# Patient Record
Sex: Male | Born: 1990 | Race: White | Hispanic: No | Marital: Single | Smoking: Current every day smoker
Health system: Southern US, Community
[De-identification: ages and names within clinical notes are randomized; demographics above are authoritative.]

## PROBLEM LIST (undated history)

## (undated) DIAGNOSIS — F1911 Other psychoactive substance abuse, in remission: Secondary | ICD-10-CM

---
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
## 2016-02-04 ENCOUNTER — Emergency Department
Admission: EM | Admit: 2016-02-04 | Discharge: 2016-02-04 | Disposition: A | Discharge status: Home / Self Care | Payer: Self-pay | Attending: Emergency Medicine | Admitting: Emergency Medicine

## 2016-02-04 DIAGNOSIS — Y9389 Activity, other specified: Secondary | ICD-10-CM | POA: Insufficient documentation

## 2016-02-04 DIAGNOSIS — F10129 Alcohol abuse with intoxication, unspecified: Secondary | ICD-10-CM | POA: Insufficient documentation

## 2016-02-04 DIAGNOSIS — S0181XA Laceration without foreign body of other part of head, initial encounter: Secondary | ICD-10-CM | POA: Insufficient documentation

## 2016-02-04 DIAGNOSIS — T148XXA Other injury of unspecified body region, initial encounter: Secondary | ICD-10-CM

## 2016-02-04 DIAGNOSIS — Y92009 Unspecified place in unspecified non-institutional (private) residence as the place of occurrence of the external cause: Secondary | ICD-10-CM | POA: Insufficient documentation

## 2016-02-04 DIAGNOSIS — F10920 Alcohol use, unspecified with intoxication, uncomplicated: Secondary | ICD-10-CM

## 2016-02-04 DIAGNOSIS — Y999 Unspecified external cause status: Secondary | ICD-10-CM | POA: Insufficient documentation

## 2016-02-04 NOTE — ED Notes (Signed)
Patient here in custody of [employer] in handcuffs for medical clearance for jail.

## 2016-02-04 NOTE — ED Notes (Addendum)
Pt was read discharge instructions. Pt verbalized understanding. Pt discharged into police custody with [place] [employer].
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
## 2018-01-12 ENCOUNTER — Emergency Department
Admission: EM | Admit: 2018-01-12 | Discharge: 2018-01-13 | Disposition: A | Discharge status: Home / Self Care | Payer: Self-pay | Attending: Emergency Medicine | Admitting: Emergency Medicine

## 2018-01-12 ENCOUNTER — Encounter: Payer: Self-pay | Admitting: Medical Oncology

## 2018-01-12 DIAGNOSIS — F1310 Sedative, hypnotic or anxiolytic abuse, uncomplicated: Secondary | ICD-10-CM

## 2018-01-12 DIAGNOSIS — Y92238 Other place in hospital as the place of occurrence of the external cause: Secondary | ICD-10-CM | POA: Insufficient documentation

## 2018-01-12 DIAGNOSIS — Y9389 Activity, other specified: Secondary | ICD-10-CM | POA: Insufficient documentation

## 2018-01-12 DIAGNOSIS — F23 Brief psychotic disorder: Secondary | ICD-10-CM

## 2018-01-12 DIAGNOSIS — W2209XA Striking against other stationary object, initial encounter: Secondary | ICD-10-CM | POA: Insufficient documentation

## 2018-01-12 DIAGNOSIS — Y999 Unspecified external cause status: Secondary | ICD-10-CM | POA: Insufficient documentation

## 2018-01-12 DIAGNOSIS — F1314 Sedative, hypnotic or anxiolytic abuse with sedative, hypnotic or anxiolytic-induced mood disorder: Secondary | ICD-10-CM | POA: Insufficient documentation

## 2018-01-12 DIAGNOSIS — S01312A Laceration without foreign body of left ear, initial encounter: Secondary | ICD-10-CM | POA: Insufficient documentation

## 2018-01-12 DIAGNOSIS — F1994 Other psychoactive substance use, unspecified with psychoactive substance-induced mood disorder: Secondary | ICD-10-CM

## 2018-01-12 LAB — ACETAMINOPHEN LEVEL: Acetaminophen (Tylenol), Serum: <10 ug/mL — ABNORMAL LOW (ref 10–30)

## 2018-01-12 LAB — CBC
HCT: 46.1 % (ref 40.0–52.0)
Hemoglobin: 15.8 g/dL (ref 13.0–18.0)
MCH: 31.1 pg (ref 26.0–34.0)
MCHC: 34.4 g/dL (ref 32.0–36.0)
MCV: 90.5 fL (ref 80.0–100.0)
PLATELETS: 246 10*3/uL (ref 150–440)
RBC: 5.09 MIL/uL (ref 4.40–5.90)
RDW: 12.5 % (ref 11.5–14.5)
WBC: 9.3 10*3/uL (ref 3.8–10.6)

## 2018-01-12 LAB — URINE DRUG SCREEN, QUALITATIVE (ARMC ONLY)
Amphetamines, Ur Screen: NOT DETECTED
BARBITURATES, UR SCREEN: NOT DETECTED
Benzodiazepine, Ur Scrn: POSITIVE — AB
CANNABINOID 50 NG, UR ~~LOC~~: NOT DETECTED
COCAINE METABOLITE, UR ~~LOC~~: NOT DETECTED
MDMA (Ecstasy)Ur Screen: NOT DETECTED
Methadone Scn, Ur: NOT DETECTED
Opiate, Ur Screen: NOT DETECTED
Phencyclidine (PCP) Ur S: NOT DETECTED
TRICYCLIC, UR SCREEN: NOT DETECTED

## 2018-01-12 LAB — COMPREHENSIVE METABOLIC PANEL
ALK PHOS: 71 U/L (ref 38–126)
ALT: 22 U/L (ref 17–63)
AST: 33 U/L (ref 15–41)
Albumin: 4.4 g/dL (ref 3.5–5.0)
Anion gap: 3 — ABNORMAL LOW (ref 5–15)
BUN: 12 mg/dL (ref 6–20)
CALCIUM: 9.3 mg/dL (ref 8.9–10.3)
CO2: 32 mmol/L (ref 22–32)
CREATININE: 0.89 mg/dL (ref 0.61–1.24)
Chloride: 104 mmol/L (ref 101–111)
Glucose, Bld: 85 mg/dL (ref 65–99)
Potassium: 5.2 mmol/L — ABNORMAL HIGH (ref 3.5–5.1)
Sodium: 139 mmol/L (ref 135–145)
TOTAL PROTEIN: 6.9 g/dL (ref 6.5–8.1)
Total Bilirubin: 0.6 mg/dL (ref 0.3–1.2)

## 2018-01-12 LAB — SALICYLATE LEVEL

## 2018-01-12 LAB — ETHANOL

## 2018-01-12 MED ORDER — HALOPERIDOL 5 MG PO TABS
5.0000 mg | ORAL_TABLET | Freq: Once | ORAL | Status: AC
  Administered 2018-01-12: 5 mg via ORAL
  Filled 2018-01-12: qty 1

## 2018-01-12 MED ORDER — HALOPERIDOL LACTATE 5 MG/ML IJ SOLN
INTRAMUSCULAR | Status: AC
  Filled 2018-01-12: qty 2

## 2018-01-12 MED ORDER — LORAZEPAM 2 MG/ML IJ SOLN
2.0000 mg | Freq: Once | INTRAMUSCULAR | Status: AC
  Administered 2018-01-12: 2 mg via INTRAMUSCULAR

## 2018-01-12 MED ORDER — LORAZEPAM 2 MG PO TABS
2.0000 mg | ORAL_TABLET | Freq: Once | ORAL | Status: AC
  Administered 2018-01-12: 2 mg via ORAL
  Filled 2018-01-12: qty 1

## 2018-01-12 MED ORDER — NICOTINE 14 MG/24HR TD PT24
14.0000 mg | MEDICATED_PATCH | Freq: Once | TRANSDERMAL | Status: DC
  Administered 2018-01-13: 14 mg via TRANSDERMAL
  Filled 2018-01-12: qty 1

## 2018-01-12 MED ORDER — DIPHENHYDRAMINE HCL 50 MG/ML IJ SOLN
INTRAMUSCULAR | Status: AC
  Administered 2018-01-12: 50 mg via INTRAMUSCULAR
  Filled 2018-01-12: qty 1

## 2018-01-12 MED ORDER — LORAZEPAM 2 MG/ML IJ SOLN
INTRAMUSCULAR | Status: AC
  Administered 2018-01-12: 2 mg via INTRAMUSCULAR
  Filled 2018-01-12: qty 1

## 2018-01-12 MED ORDER — DIPHENHYDRAMINE HCL 50 MG/ML IJ SOLN
50.0000 mg | Freq: Once | INTRAMUSCULAR | Status: AC
  Administered 2018-01-12: 50 mg via INTRAMUSCULAR

## 2018-01-12 MED ORDER — HALOPERIDOL LACTATE 5 MG/ML IJ SOLN
5.0000 mg | Freq: Once | INTRAMUSCULAR | Status: AC
  Administered 2018-01-12: 5 mg via INTRAMUSCULAR

## 2018-01-12 NOTE — ED Notes (Signed)
BEHAVIORAL HEALTH ROUNDING Patient sleeping: Yes.   Patient alert and oriented: not applicable SLEEPING Behavior appropriate: Yes.  ; If no, describe: SLEEPING Nutrition and fluids offered: No SLEEPING Toileting and hygiene offered: NoSLEEPING Sitter present: not applicable, Q 15 min safety rounds and observation. Law enforcement present: Yes ODS 
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
## 2018-01-12 NOTE — ED Notes (Signed)
Patient is IVC pending soc result Brian Osborn R.M.A
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
## 2018-01-12 NOTE — ED Notes (Signed)
This RN explained to the patient that he would most likely be here until sometime tomorrow and that he had been given medication to help him be calm and rest. Explained to pt that he should attempt to lay down on the bed and rest. Pt agreeable.

## 2018-01-12 NOTE — ED Notes (Signed)
Patient attempted to leave and had to be assisted back to his chair.

## 2018-01-12 NOTE — ED Notes (Signed)
ENVIRONMENTAL ASSESSMENT  Potentially harmful objects out of patient reach: Yes.  Personal belongings secured: Yes.  Patient dressed in hospital provided attire only: Yes.  Plastic bags out of patient reach: Yes.  Patient care equipment (cords, cables, call bells, lines, and drains) shortened, removed, or accounted for: Yes.  Equipment and supplies removed from bottom of stretcher: Yes.  Potentially toxic materials out of patient reach: Yes.  Sharps container removed or out of patient reach: Yes.   BEHAVIORAL HEALTH ROUNDING  Patient sleeping: No.  Patient alert and oriented: yes  Behavior appropriate: Yes. ; If no, describe:  Nutrition and fluids offered: Yes  Toileting and hygiene offered: Yes  Sitter present: yes for fall safety, Q 15 min safety rounds and observation.  Law enforcement present: Yes ODS  ED BHU PLACEMENT JUSTIFICATION  Is the patient under IVC or is there intent for IVC: Yes.  Is the patient medically cleared: Yes.  Is there vacancy in the ED BHU: Yes.  Is the population mix appropriate for patient: Yes.  Is the patient awaiting placement in inpatient or outpatient setting: Yes.  Has the patient had a psychiatric consult: Yes.  Survey of unit performed for contraband, proper placement and condition of furniture, tampering with fixtures in bathroom, shower, and each patient room: Yes. ; Findings: All clear  APPEARANCE/BEHAVIOR  Restless, agitated but redirectable and will follow commands at this time.   NEURO ASSESSMENT  Orientation: time, place and person  Hallucinations: No.None noted (Hallucinations)  Speech: Normal  Gait: slightly unsteady at this time pt has safety sitter at bedside.  RESPIRATORY ASSESSMENT  WNL  CARDIOVASCULAR ASSESSMENT  WNL  GASTROINTESTINAL ASSESSMENT  WNL  EXTREMITIES  WNL  PLAN OF CARE  Provide calm/safe environment. Vital signs assessed TID. ED BHU Assessment once each 12-hour shift. Collaborate with TTS daily or as condition  indicates. Assure the ED provider has rounded once each shift. Provide and encourage hygiene. Provide redirection as needed. Assess for escalating behavior; address immediately and inform ED provider.  Assess family dynamic and appropriateness for visitation as needed: Yes. ; If necessary, describe findings:  Educate the patient/family about BHU procedures/visitation: Yes. ; If necessary, describe findings: Pt is calm and cooperative at this time. Pt understanding and accepting of unit procedures/rules. Will continue to monitor with Q 15 min safety rounds and observation.

## 2018-01-12 NOTE — ED Notes (Signed)
Pt at this time has gone to sleep on the bed.

## 2018-01-12 NOTE — ED Provider Notes (Signed)
TTS attempted to conduct assessment.. Brian Osborn refused to participate.

## 2018-01-12 NOTE — ED Notes (Signed)
At 1030, patient became agitated stating "I'm not crazy.  You can't hold me prisoner."  Patient had been sitting, calm and cooperative, in recliner chair with Officer [place] sitting next to him.  Attempts made to verbally de-escalate patient, unsuccessful.  Patient jumped  out of chair and attempted to run from department, running through nursing station.  Patient tripped and fell forward, hitting forehead.  Laceration to left ear lobe.  No LOC.  Dr. Fanny BienQuale notified and immediately to patient to assess.  Medicated as per MAR to calm patient down, because after fall patient continued to be agitated, yelling out -- not responding to verbal attempts to de-escalate.  Patient sitting in recliner chair at this time.  Sitter with patient for safety.

## 2018-01-12 NOTE — ED Notes (Signed)
Patient transported to interview room with security and [facility]

## 2018-01-13 DIAGNOSIS — F1310 Sedative, hypnotic or anxiolytic abuse, uncomplicated: Secondary | ICD-10-CM

## 2018-01-13 DIAGNOSIS — F1994 Other psychoactive substance use, unspecified with psychoactive substance-induced mood disorder: Secondary | ICD-10-CM

## 2018-01-13 MED ORDER — NICOTINE 14 MG/24HR TD PT24
14.0000 mg | MEDICATED_PATCH | Freq: Once | TRANSDERMAL | Status: DC
  Administered 2018-01-13: 14 mg via TRANSDERMAL

## 2018-01-13 MED ORDER — NICOTINE 14 MG/24HR TD PT24
MEDICATED_PATCH | TRANSDERMAL | Status: AC
Start: 2018-01-13 — End: 2018-01-13
  Administered 2018-01-13: 14 mg
  Filled 2018-01-13: qty 1

## 2018-01-13 NOTE — ED Notes (Signed)
Hourly rounding reveals patient sleeping in room. No complaints, stable, in no acute distress. Q15 minute rounds and monitoring via Security Cameras to continue. 

## 2018-01-13 NOTE — ED Notes (Signed)
Patient voices understanding of discharge instructions, all belongings given back to Patient, Patient left per self. No signs of distress.
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
## 2018-01-13 NOTE — ED Notes (Signed)
Patient sitting in dayroom, calm and cooperative, no signs of distress.

## 2018-01-13 NOTE — ED Provider Notes (Signed)
Dr. Blair Haileylapcs is lifting IVC and recommends outpatient management.   Merrily Brittleifenbark, Harvey Lingo, MD 01/13/18 [phone]

## 2018-01-13 NOTE — ED Notes (Signed)
BEHAVIORAL HEALTH ROUNDING Patient sleeping: Yes.   Patient alert and oriented: not applicable SLEEPING Behavior appropriate: Yes.  ; If no, describe: SLEEPING Nutrition and fluids offered: No SLEEPING Toileting and hygiene offered: NoSLEEPING Sitter present: not applicable, Q 15 min safety rounds and observation. Law enforcement present: Yes ODS 
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
## 2018-01-13 NOTE — ED Notes (Signed)
IVC rescinded by Dr Clapacs/ To be D/C'd  

## 2018-01-13 NOTE — ED Notes (Signed)
Pt. Transferred to BHU from ED to room 5 after screening for contraband. Report to include Situation, Background, Assessment and Recommendations from Shelba FlakeAnne Cales RN. Pt. Oriented to unit including Q15 minute rounds as well as the security cameras for their protection. Patient is alert and oriented, warm and dry in no acute distress. Patient denies SI, HI, and AVH. Pt. Encouraged to let me know if needs arise.

## 2018-01-13 NOTE — ED Provider Notes (Signed)
-----------------------------------------   7:19 AM on 01/13/2018 -----------------------------------------   Blood pressure 102/61, pulse 81, temperature 97.7 F (36.5 C), temperature source Oral, resp. rate 18, height 6\' 2"  (1.88 m), weight 73.5 kg (162 lb), SpO2 98 %.  The patient had no acute events since last update.  Calm and cooperative at this time.  Disposition is pending Psychiatry/Behavioral Medicine team recommendations.     Rebecka ApleyWebster, Allison P, MD 01/13/18 [phone]

## 2018-01-13 NOTE — ED Notes (Signed)
Patient is up to bathroom, no signs of distress, no behavioral issues, will continue to monitor.

## 2018-02-14 ENCOUNTER — Emergency Department
Admission: EM | Admit: 2018-02-14 | Discharge: 2018-02-14 | Disposition: A | Discharge status: Home / Self Care | Payer: Self-pay | Attending: Emergency Medicine | Admitting: Emergency Medicine

## 2018-02-14 ENCOUNTER — Emergency Department: Payer: Self-pay

## 2018-02-14 ENCOUNTER — Other Ambulatory Visit: Payer: Self-pay

## 2018-02-14 ENCOUNTER — Encounter: Payer: Self-pay | Admitting: Emergency Medicine

## 2018-02-14 DIAGNOSIS — Y929 Unspecified place or not applicable: Secondary | ICD-10-CM | POA: Insufficient documentation

## 2018-02-14 DIAGNOSIS — Y999 Unspecified external cause status: Secondary | ICD-10-CM | POA: Insufficient documentation

## 2018-02-14 DIAGNOSIS — S060X0A Concussion without loss of consciousness, initial encounter: Secondary | ICD-10-CM | POA: Insufficient documentation

## 2018-02-14 DIAGNOSIS — H05233 Hemorrhage of bilateral orbit: Secondary | ICD-10-CM

## 2018-02-14 DIAGNOSIS — S0012XA Contusion of left eyelid and periocular area, initial encounter: Secondary | ICD-10-CM | POA: Insufficient documentation

## 2018-02-14 DIAGNOSIS — S022XXA Fracture of nasal bones, initial encounter for closed fracture: Secondary | ICD-10-CM | POA: Insufficient documentation

## 2018-02-14 DIAGNOSIS — S0011XA Contusion of right eyelid and periocular area, initial encounter: Secondary | ICD-10-CM | POA: Insufficient documentation

## 2018-02-14 DIAGNOSIS — H5711 Ocular pain, right eye: Secondary | ICD-10-CM | POA: Insufficient documentation

## 2018-02-14 DIAGNOSIS — F17210 Nicotine dependence, cigarettes, uncomplicated: Secondary | ICD-10-CM | POA: Insufficient documentation

## 2018-02-14 DIAGNOSIS — J3489 Other specified disorders of nose and nasal sinuses: Secondary | ICD-10-CM | POA: Insufficient documentation

## 2018-02-14 DIAGNOSIS — Z5321 Procedure and treatment not carried out due to patient leaving prior to being seen by health care provider: Secondary | ICD-10-CM | POA: Insufficient documentation

## 2018-02-14 DIAGNOSIS — H5712 Ocular pain, left eye: Secondary | ICD-10-CM | POA: Insufficient documentation

## 2018-02-14 DIAGNOSIS — Y9389 Activity, other specified: Secondary | ICD-10-CM | POA: Insufficient documentation

## 2018-02-14 MED ORDER — OXYCODONE HCL 5 MG PO TABS: 5.0000 mg | ORAL_TABLET | Freq: Three times a day (TID) | ORAL | 0 refills | Status: DC | PRN

## 2018-02-14 MED ORDER — OXYCODONE HCL 5 MG PO TABS
5.0000 mg | ORAL_TABLET | Freq: Once | ORAL | Status: AC
  Administered 2018-02-14: 5 mg via ORAL
  Filled 2018-02-14: qty 1

## 2018-02-14 MED ORDER — IBUPROFEN 600 MG PO TABS: 600.0000 mg | ORAL_TABLET | Freq: Four times a day (QID) | ORAL | 0 refills | Status: DC | PRN

## 2018-02-14 NOTE — ED Notes (Signed)
Pt repeatedly out of room wandering around department and multiple requests made by nursing staff for pt to return to room. Pt states "then why don't you go and [place] you'd be perfect for that".
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
## 2018-02-14 NOTE — Discharge Instructions (Addendum)
Please apply ice to the nose to help improve swelling over the next few days.  Call ENT physician Monday morning to schedule follow-up appointment for 3 to 4 days from now.  Return to the ER for any increasing headache, nausea, vomiting, worsening symptoms or urgent changes in your health.

## 2018-02-14 NOTE — ED Notes (Signed)
Patient transported to CT 

## 2018-02-14 NOTE — ED Notes (Signed)
Patient attempted to be taken to a room, and is declining to be evaluated now.  Patient advised to stay by staff, but refused at this time and states he will return tomorrow when he can be seen sooner.
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
## 2018-02-14 NOTE — ED Notes (Signed)
I escorted Mr. Brian Osborn to room 54. He then asked me would this take a long time. I stated that we were very busy and was unable to give him a specific timeframe. He then stated to me "can you just look at me and tell me what is wrong." I said no. You must be evaluated by a physician. He then stated, "I have more important tings to do, I will return to the ER tomorrow if I need to."  He then walked out and went back to the lobby. I explain to first nurse RN A. Moffitt what just happened and she is going to remove him from the board.
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
## 2018-05-21 ENCOUNTER — Other Ambulatory Visit: Payer: Self-pay

## 2018-05-21 ENCOUNTER — Emergency Department
Admission: EM | Admit: 2018-05-21 | Discharge: 2018-05-22 | Disposition: A | Discharge status: Home / Self Care | Payer: Self-pay | Attending: Emergency Medicine | Admitting: Emergency Medicine

## 2018-05-21 DIAGNOSIS — F17210 Nicotine dependence, cigarettes, uncomplicated: Secondary | ICD-10-CM | POA: Insufficient documentation

## 2018-05-21 DIAGNOSIS — F1994 Other psychoactive substance use, unspecified with psychoactive substance-induced mood disorder: Secondary | ICD-10-CM | POA: Insufficient documentation

## 2018-05-21 DIAGNOSIS — F29 Unspecified psychosis not due to a substance or known physiological condition: Secondary | ICD-10-CM

## 2018-05-21 DIAGNOSIS — Z79899 Other long term (current) drug therapy: Secondary | ICD-10-CM | POA: Insufficient documentation

## 2018-05-21 LAB — URINE DRUG SCREEN, QUALITATIVE (ARMC ONLY)
Amphetamines, Ur Screen: POSITIVE — AB
BARBITURATES, UR SCREEN: NOT DETECTED
Cannabinoid 50 Ng, Ur ~~LOC~~: NOT DETECTED
Cocaine Metabolite,Ur ~~LOC~~: NOT DETECTED
MDMA (Ecstasy)Ur Screen: NOT DETECTED
Methadone Scn, Ur: NOT DETECTED
Opiate, Ur Screen: NOT DETECTED
Phencyclidine (PCP) Ur S: NOT DETECTED
TRICYCLIC, UR SCREEN: NOT DETECTED

## 2018-05-21 LAB — COMPREHENSIVE METABOLIC PANEL
ALK PHOS: 67 U/L (ref 38–126)
ALT: 22 U/L (ref 0–44)
AST: 36 U/L (ref 15–41)
Albumin: 5 g/dL (ref 3.5–5.0)
Anion gap: 8 (ref 5–15)
BILIRUBIN TOTAL: 1.5 mg/dL — AB (ref 0.3–1.2)
BUN: 11 mg/dL (ref 6–20)
CALCIUM: 9.3 mg/dL (ref 8.9–10.3)
CO2: 25 mmol/L (ref 22–32)
CREATININE: 0.88 mg/dL (ref 0.61–1.24)
Chloride: 105 mmol/L (ref 98–111)
Glucose, Bld: 111 mg/dL — ABNORMAL HIGH (ref 70–99)
Potassium: 4 mmol/L (ref 3.5–5.1)
SODIUM: 138 mmol/L (ref 135–145)
TOTAL PROTEIN: 7.3 g/dL (ref 6.5–8.1)

## 2018-05-21 LAB — CBC WITH DIFFERENTIAL/PLATELET
Basophils Absolute: 0 10*3/uL (ref 0–0.1)
Basophils Relative: 1 %
EOS ABS: 0.2 10*3/uL (ref 0–0.7)
EOS PCT: 2 %
HCT: 41.9 % (ref 40.0–52.0)
Hemoglobin: 15 g/dL (ref 13.0–18.0)
LYMPHS ABS: 1 10*3/uL (ref 1.0–3.6)
Lymphocytes Relative: 14 %
MCH: 31.8 pg (ref 26.0–34.0)
MCHC: 35.7 g/dL (ref 32.0–36.0)
MCV: 89 fL (ref 80.0–100.0)
MONOS PCT: 8 %
Monocytes Absolute: 0.6 10*3/uL (ref 0.2–1.0)
Neutro Abs: 5.1 10*3/uL (ref 1.4–6.5)
Neutrophils Relative %: 75 %
PLATELETS: 217 10*3/uL (ref 150–440)
RBC: 4.71 MIL/uL (ref 4.40–5.90)
RDW: 12.3 % (ref 11.5–14.5)
WBC: 6.9 10*3/uL (ref 3.8–10.6)

## 2018-05-21 LAB — LIPASE, BLOOD: Lipase: 22 U/L (ref 11–51)

## 2018-05-21 MED ORDER — LORAZEPAM 2 MG/ML IJ SOLN
2.0000 mg | Freq: Once | INTRAMUSCULAR | Status: AC
Start: 2018-05-21 — End: 2018-05-21
  Administered 2018-05-21: 2 mg via INTRAVENOUS

## 2018-05-21 MED ORDER — HALOPERIDOL LACTATE 5 MG/ML IJ SOLN
5.0000 mg | Freq: Once | INTRAMUSCULAR | Status: AC
  Administered 2018-05-21: 5 mg via INTRAMUSCULAR
  Filled 2018-05-21: qty 1

## 2018-05-21 MED ORDER — LORAZEPAM 2 MG/ML IJ SOLN
2.0000 mg | Freq: Once | INTRAMUSCULAR | Status: AC
  Administered 2018-05-21: 2 mg via INTRAMUSCULAR

## 2018-05-21 MED ORDER — LORAZEPAM 2 MG/ML IJ SOLN
INTRAMUSCULAR | Status: AC
  Filled 2018-05-21: qty 1

## 2018-05-21 NOTE — ED Triage Notes (Signed)
Pt arrives via ems from neighbors house. Ems states pt went to neighbors house naked stating he was being bitten by knats and bugs were crawling on him. Ems states pt poured alcohol over head trying to kill bugs but pt states did not work. pt states snorted meth this morning. Pt also took two  Benadryl pill due to bites burning

## 2018-05-21 NOTE — ED Notes (Addendum)
Rash (red,blottchy, and flat in appearance) appeared on chest and stomach but is now clearing up. Pt became tearful and upset when rash started to appear on torso and arms. RN was also pushing IV ativan at the time rash started to appear but quickly cleared up and no longer visible. Pt states he is not allergic to ativan and has taken it in the past. Unsure if rash may be possible reaction to ativan or due to becoming extremely upset and crying

## 2018-05-21 NOTE — Consult Note (Signed)
  Psychiatry: Consult received.  Chart reviewed.  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  He has been knocked out sleeping throughout the day.  I have tried several times to wake him up for assessment so far without success.  Patient is currently physically stable.  I will attempt to reassess him tomorrow morning when, I hope, he has woken up enough to have a conversation.

## 2018-05-21 NOTE — BH Assessment (Signed)
Assessment Note  Brian Osborn is an 27 y.o. male. Patient presents to [facility] via EMS due neighbor reporting patient went their home naked stating being bitten knats and bugs were crawling on him. EMS states patient poured alcohol over his head trying to kill bugs. Patient denies substance use during assessment, however triage note indicating he snorted meth this morning and took two Benedryl pill due to bites burning. Patient denies SI/HI/AVH.  Patient has a court date 06/05/2018 due shoplifting concealment goods and probation violation.   Patient denies inpatient psychiatric treatment and outpatient mental health.  Diagnosis: Substance Induced Disorder  Past Medical History: History reviewed. No pertinent past medical history.  History reviewed. No pertinent surgical history.  Family History: History reviewed. No pertinent family history.  Social History:  reports that he has been smoking cigarettes. He has been smoking about 0.50 packs per day. He has never used smokeless tobacco. He reports that he drinks alcohol. He reports that he has current or past drug history. Drug: Methamphetamines.  Additional Social History:  Alcohol / Drug Use Pain Medications: SEE PTA  Prescriptions: SEE PTA  Over the Counter: SEE PTA  History of alcohol / drug use?: Yes Longest period of sobriety (when/how long): Unknown Substance #1 Name of Substance 1: Meth  1 - Age of First Use: Unknown 1 - Amount (size/oz): Unknown 1 - Frequency: Unknown 1 - Duration: Unknown 1 - Last Use / Amount: Unknown  CIWA: CIWA-Ar BP: 107/62 Pulse Rate: 66 COWS:    Allergies:  Allergies  Allergen Reactions  . Bee Venom Hives    Home Medications:  (Not in a hospital admission)  OB/GYN Status:  No LMP for male patient.  General Assessment Data Assessment unable to be completed: (Assessment) [facility] Assessment: In system Is this a Tele or Face-to-Face Assessment?: Tele  Assessment Is this an Initial Assessment or a Re-assessment for this encounter?: Initial Assessment Marital status: Single Maiden name: None reported Is patient pregnant?: No Pregnancy Status: No Living Arrangements: Parent Can pt return to current living arrangement?: Yes Admission Status: Involuntary Is patient capable of signing voluntary admission?: Yes Referral Source: Self/Family/Friend Insurance type: No insurance  Medical Screening Exam [facility] Walk-in ONLY) Medical Exam completed: Yes  Crisis Care Plan Living Arrangements: Parent Legal Guardian: Other:(None reported) Name of Psychiatrist: None reported  Name of Therapist: None reported  Education Status Is patient currently in school?: No Is the patient employed, unemployed or receiving disability?: Unemployed  Risk to self with the past 6 months Suicidal Ideation: No Has patient been a risk to self within the past 6 months prior to admission? : No Suicidal Intent: No Has patient had any suicidal intent within the past 6 months prior to admission? : No Is patient at risk for suicide?: No Suicidal Plan?: No Has patient had any suicidal plan within the past 6 months prior to admission? : No Access to Means: No What has been your use of drugs/alcohol within the last 12 months?: Meth Previous Attempts/Gestures: No How many times?: 0 Other Self Harm Risks: Ongoing subtance use Triggers for Past Attempts: Other (Comment)(None reported) Intentional Self Injurious Behavior: None Family Suicide History: No Recent stressful life event(s): Other (Comment)(None reported) Persecutory voices/beliefs?: No Depression: No Depression Symptoms: (None reported) Substance abuse history and/or treatment for substance abuse?: Yes Suicide prevention information given to non-admitted patients: Not applicable  Risk to Others within the past 6 months Homicidal Ideation: No Does patient have any lifetime risk  of violence toward others  beyond the six months prior to admission? : No Thoughts of Harm to Others: No Current Homicidal Intent: No Current Homicidal Plan: No Access to Homicidal Means: No Identified Victim: None reported History of harm to others?: No Assessment of Violence: None Noted Violent Behavior Description: None reported Does patient have access to weapons?: No Criminal Charges Pending?: Yes Describe Pending Criminal Charges: shoplifting concealment goods, probation violation Does patient have a court date: Yes Court Date: 06/05/18 Is patient on probation?: Yes  Psychosis Hallucinations: None noted Delusions: Somatic  Mental Status Report Appearance/Hygiene: Other (Comment)(unclothed, with a sheet covering private area) Eye Contact: Fair Motor Activity: Mannerisms Speech: Pressured Level of Consciousness: Alert Mood: Anxious, Suspicious Affect: Anxious Anxiety Level: Severe Thought Processes: Circumstantial Judgement: Impaired Orientation: Person, Place, Time, Situation, Appropriate for developmental age Obsessive Compulsive Thoughts/Behaviors: None  Cognitive Functioning Concentration: Fair Memory: Recent Intact, Remote Intact Is patient IDD: No Is patient DD?: No Insight: Poor Impulse Control: Poor Appetite: Good Have you had any weight changes? : No Change Sleep: No Change Total Hours of Sleep: 7 Vegetative Symptoms: None  ADLScreening [facility] Assessment Services) Patient's cognitive ability adequate to safely complete daily activities?: Yes Patient able to express need for assistance with ADLs?: Yes Independently performs ADLs?: Yes (appropriate for developmental age)  Prior Inpatient Therapy Prior Inpatient Therapy: No  Prior Outpatient Therapy Prior Outpatient Therapy: No Does patient have an ACCT team?: No Does patient have Intensive In-House Services?  : No Does patient have [facility] services? : No Does patient have P4CC services?: No  ADL Screening (condition at  time of admission) Patient's cognitive ability adequate to safely complete daily activities?: Yes Is the patient deaf or have difficulty hearing?: No Does the patient have difficulty seeing, even when wearing glasses/contacts?: No Does the patient have difficulty concentrating, remembering, or making decisions?: No Patient able to express need for assistance with ADLs?: Yes Does the patient have difficulty dressing or bathing?: No Independently performs ADLs?: Yes (appropriate for developmental age) Does the patient have difficulty walking or climbing stairs?: No Weakness of Legs: None Weakness of Arms/Hands: None  Home Assistive Devices/Equipment Home Assistive Devices/Equipment: None  Therapy Consults (therapy consults require a physician order) PT Evaluation Needed: No OT Evalulation Needed: No SLP Evaluation Needed: No       Advance Directives (For Healthcare) Does Patient Have a Medical Advance Directive?: No Would patient like information on creating a medical advance directive?: No - Patient declined          Disposition:  Disposition Initial Assessment Completed for this Encounter: Yes Patient referred to: Other (Comment)(pending psych consult )  On Site Evaluation by:   Reviewed with Physician:    Galen ManilaFEDORIA L Omolola Mittman, LPC, LCAS-A 05/21/2018 2:02 PM

## 2018-05-21 NOTE — ED Notes (Signed)
Woke pt up and he is calm and cooperative. Pt says that he did have tiny spiders all over him last night and he was snorting meth. Pt ambulatory to give urine.

## 2018-05-21 NOTE — ED Notes (Signed)
Hourly rounding reveals patient sleeping in room. No complaints, stable, in no acute distress. Q15 minute rounds and monitoring via Security Cameras to continue. 

## 2018-05-21 NOTE — ED Notes (Signed)
Pt ambulated to the bathroom and provided UA. Pt states "they don't believe that spiders were climbing all over me, but they were cause I got in a nest of them."

## 2018-05-21 NOTE — ED Notes (Signed)
Pt. Placed in BHU #5 from quad.  Pt. States he has been in unit before.  Pt. Advised of cameras in Unit and 15 min. Safety checks.  Pt. Given pair of socks.  IV in lt. Forearm removed via unit policy.  Pt. Requested and was given meal tray, OJ drink and remote for tv.

## 2018-05-21 NOTE — ED Notes (Signed)
Pt states he has bugs or spiders all over body biting him. No bugs observed/ present on pt at this time. No rash or bite marks present on body at this time. Pt tearful during assessment and very anxious. Pt stripping clothing and blankets off body because he states "they are all over me, biting me, its burning, and I have venom streaming through my body". Pt redirected by RN in attempts to calm pt. Pt remains very anxious and tachycardic at this time.

## 2018-05-21 NOTE — ED Provider Notes (Signed)
[facility] Emergency Department Provider Note   ____________________________________________   First MD Initiated Contact with Patient 05/21/18 1136     (approximate)  I have reviewed the triage vital signs and the nursing notes.   HISTORY  Chief Complaint Drug Problem and Hallucinations    HPI Wende MottKory L Chandonnet is a 27 y.o. male patient comes via EMS.  Patient with a neighbor's house naked saying he was being bitten by knots and bugs were crawling in him in on him.  Patient in the emergency room remains naked saying his stomach is dissolving and the baby spiders or eating him a biting him.  He points to some lint on his arm and says that that is what that is.  He snorted meth this morning.  He also took 2 Benadryl's p.o.   History reviewed. No pertinent past medical history.  Patient Active Problem List   Diagnosis Date Noted  . Substance induced mood disorder (HCC) 01/13/2018  . Benzodiazepine abuse (HCC) 01/13/2018    History reviewed. No pertinent surgical history.  Prior to Admission medications   Medication Sig Start Date End Date Taking? Authorizing Provider  ibuprofen (ADVIL,MOTRIN) 600 MG tablet Take 1 tablet (600 mg total) by mouth every 6 (six) hours as needed for moderate pain. 02/14/18   Evon SlackGaines, Thomas C, PA-C  oxyCODONE (ROXICODONE) 5 MG immediate release tablet Take 1 tablet (5 mg total) by mouth every 8 (eight) hours as needed. 02/14/18 02/14/19  Evon SlackGaines, Thomas C, PA-C    Allergies Bee venom  History reviewed. No pertinent family history.  Social History Social History   Tobacco Use  . Smoking status: Current Every Day Smoker    Packs/day: 0.50    Types: Cigarettes  . Smokeless tobacco: Never Used  Substance Use Topics  . Alcohol use: Yes    Comment: socially  . Drug use: Yes    Types: Methamphetamines    Comment: snorted today    Review of Systems  Constitutional: No fever/chills Eyes: No visual changes. ENT: No sore  throat. Cardiovascular: Denies chest pain. Respiratory: Denies shortness of breath. Gastrointestinal:  abdominal pain.  No nausea, no vomiting.  No diarrhea.  No constipation. Genitourinary: Negative for dysuria. Musculoskeletal: Negative for back pain. Skin: Negative for rash. Neurological: Negative for headaches, focal weakness   ____________________________________________   PHYSICAL EXAM:  VITAL SIGNS: ED Triage Vitals  Enc Vitals Group     BP 05/21/18 1141 (!) 153/115     Pulse Rate 05/21/18 1141 (!) 130     Resp 05/21/18 1141 20     Temp 05/21/18 1141 98 F (36.7 C)     Temp Source 05/21/18 1141 Oral     SpO2 05/21/18 1141 98 %     Weight 05/21/18 1143 175 lb (79.4 kg)     Height 05/21/18 1143 6\' 2"  (1.88 m)     Head Circumference --      Peak Flow --      Pain Score 05/21/18 1142 10     Pain Loc --      Pain Edu? --      Excl. in GC? --     Constitutional: Alert and oriented.  Very anxious completely naked getting agitated Eyes: Conjunctivae are normal.  Pupils are dilated. EOMI. Head: Atraumatic. Nose: No congestion/rhinnorhea. Mouth/Throat: Mucous membranes are moist.  Oropharynx non-erythematous. Neck: No stridor.  Cardiovascular: Normal rate, regular rhythm. Grossly normal heart sounds.  Good peripheral circulation. Respiratory: Normal respiratory effort.  No  retractions. Lungs CTAB. Gastrointestinal: Soft and nontender. No distention. No abdominal bruits. No CVA tenderness. Musculoskeletal: No lower extremity tenderness nor edema.  . Neurologic:  Normal speech and language. No gross focal neurologic deficits are appreciated. Skin:  Skin is warm, dry there are some scratches where he is been scratching on his skin.  The deepest one is just barely break the skin. Psychiatric: Patient is actively hallucinating.  ____________________________________________   LABS (all labs ordered are listed, but only abnormal results are displayed)  Labs Reviewed    COMPREHENSIVE METABOLIC PANEL - Abnormal; Notable for the following components:      Result Value   Glucose, Bld 111 (*)    Total Bilirubin 1.5 (*)    All other components within normal limits  LIPASE, BLOOD  CBC WITH DIFFERENTIAL/PLATELET  URINE DRUG SCREEN, QUALITATIVE ([facility] ONLY)   ____________________________________________  EKG  EKG read and interpreted by me shows sinus tachycardia rate of 111 rightward axis no acute ST-T wave changes except for some possible early repolarization. ____________________________________________  RADIOLOGY  ED MD interpretation:   Official radiology report(s): No results found.  ____________________________________________   PROCEDURES  Procedure(s) performed:   Procedures  Critical Care performed:  ____________________________________________   INITIAL IMPRESSION / ASSESSMENT AND PLAN / ED COURSE   She is lab work is essentially negative psychosis seems to come on after using his methamphetamine we will observe him wait for him to clear if at all possible have psychiatry see him.        ____________________________________________   FINAL CLINICAL IMPRESSION(S) / ED DIAGNOSES  Final diagnoses:  Psychosis, unspecified psychosis type [facility](HCC)     ED Discharge Orders    None       Note:  This document was prepared using Dragon voice recognition software and may include unintentional dictation errors.    Arnaldo NatalMalinda, Aaron Bostwick F, MD 05/21/18 [phone]

## 2018-05-22 MED ORDER — NICOTINE 21 MG/24HR TD PT24
21.0000 mg | MEDICATED_PATCH | Freq: Once | TRANSDERMAL | Status: DC
  Administered 2018-05-22: 21 mg via TRANSDERMAL
  Filled 2018-05-22: qty 1

## 2018-05-22 NOTE — ED Notes (Signed)
Hourly rounding reveals patient sleeping in room. No complaints, stable, in no acute distress. Q15 minute rounds and monitoring via Security Cameras to continue. 

## 2018-05-22 NOTE — ED Notes (Signed)
Patient was discharged per order. AVS was reviewed with patient. Pt was given an opportunity to ask questions and verbalized understanding of all discharge paperwork. One bag of belonging was returned, and patient verbalized receipt Patient said he was ready for discharge and appeared in no acute distress when escorted to lobby where a ride was coming to pick him up.

## 2018-05-22 NOTE — ED Notes (Signed)
Pt used phone to call for ride.

## 2018-05-22 NOTE — ED Notes (Signed)
Brian Osborn is irritable, asking when the doctor will be here so he can be released. He says he only came in "for an allergic reaction" and that it is unfair that he has been detained. He denies any delusions, AVH, SI, or HI. He asked for a nicotine patch for withdrawal, which was provided. Will monitor for needs/safety.

## 2018-05-22 NOTE — ED Provider Notes (Signed)
Patient has been cleared by Dr. Toni Amendlapacs for discharge   Brian Osborn, Jonathan E, MD 05/22/18 [phone]

## 2018-05-22 NOTE — ED Notes (Signed)
Report received from previous shift. Pt is resting quietly in room, with eyes closed and regular, even, and unlabored respirations. Will continue to monitor for needs and safety.  

## 2018-05-22 NOTE — ED Notes (Signed)
Hourly rounding reveals patient in rest room. No complaints, stable, in no acute distress. Q15 minute rounds and monitoring via Security Cameras to continue. 

## 2018-05-22 NOTE — ED Notes (Signed)
Hourly rounding reveals patient in sleeping room. No complaints, stable, in no acute distress. Q15 minute rounds and monitoring via Security Cameras to continue. 

## 2018-06-19 ENCOUNTER — Encounter: Payer: Self-pay | Admitting: Emergency Medicine

## 2018-06-19 ENCOUNTER — Other Ambulatory Visit: Payer: Self-pay

## 2018-06-19 ENCOUNTER — Emergency Department
Admission: EM | Admit: 2018-06-19 | Discharge: 2018-06-19 | Disposition: A | Discharge status: Home / Self Care | Payer: Self-pay | Attending: Emergency Medicine | Admitting: Emergency Medicine

## 2018-06-19 DIAGNOSIS — F1510 Other stimulant abuse, uncomplicated: Secondary | ICD-10-CM

## 2018-06-19 DIAGNOSIS — F17210 Nicotine dependence, cigarettes, uncomplicated: Secondary | ICD-10-CM | POA: Insufficient documentation

## 2018-06-19 DIAGNOSIS — F15950 Other stimulant use, unspecified with stimulant-induced psychotic disorder with delusions: Secondary | ICD-10-CM

## 2018-06-19 DIAGNOSIS — F29 Unspecified psychosis not due to a substance or known physiological condition: Secondary | ICD-10-CM

## 2018-06-19 DIAGNOSIS — F19951 Other psychoactive substance use, unspecified with psychoactive substance-induced psychotic disorder with hallucinations: Secondary | ICD-10-CM | POA: Insufficient documentation

## 2018-06-19 LAB — URINE DRUG SCREEN, QUALITATIVE (ARMC ONLY)
Amphetamines, Ur Screen: POSITIVE — AB
Barbiturates, Ur Screen: NOT DETECTED
Benzodiazepine, Ur Scrn: POSITIVE — AB
COCAINE METABOLITE, UR ~~LOC~~: NOT DETECTED
Cannabinoid 50 Ng, Ur ~~LOC~~: NOT DETECTED
MDMA (ECSTASY) UR SCREEN: NOT DETECTED
Methadone Scn, Ur: NOT DETECTED
OPIATE, UR SCREEN: NOT DETECTED
PHENCYCLIDINE (PCP) UR S: NOT DETECTED
Tricyclic, Ur Screen: NOT DETECTED

## 2018-06-19 LAB — COMPREHENSIVE METABOLIC PANEL
ALBUMIN: 5 g/dL (ref 3.5–5.0)
ALT: 36 U/L (ref 0–44)
AST: 73 U/L — AB (ref 15–41)
Alkaline Phosphatase: 79 U/L (ref 38–126)
Anion gap: 10 (ref 5–15)
BUN: 25 mg/dL — AB (ref 6–20)
CHLORIDE: 101 mmol/L (ref 98–111)
CO2: 26 mmol/L (ref 22–32)
CREATININE: 1.08 mg/dL (ref 0.61–1.24)
Calcium: 9.4 mg/dL (ref 8.9–10.3)
GFR calc Af Amer: >60 mL/min (ref >60)
GFR calc non Af Amer: >60 mL/min (ref >60)
GLUCOSE: 200 mg/dL — AB (ref 70–99)
POTASSIUM: 3.6 mmol/L (ref 3.5–5.1)
Sodium: 137 mmol/L (ref 135–145)
Total Bilirubin: 0.9 mg/dL (ref 0.3–1.2)
Total Protein: 8.1 g/dL (ref 6.5–8.1)

## 2018-06-19 LAB — ACETAMINOPHEN LEVEL: Acetaminophen (Tylenol), Serum: <10 ug/mL — ABNORMAL LOW (ref 10–30)

## 2018-06-19 LAB — CBC WITH DIFFERENTIAL/PLATELET
Basophils Absolute: 0.1 10*3/uL (ref 0–0.1)
Basophils Relative: 1 %
EOS PCT: 0 %
Eosinophils Absolute: 0 10*3/uL (ref 0–0.7)
HCT: 41.5 % (ref 40.0–52.0)
Hemoglobin: 14.9 g/dL (ref 13.0–18.0)
LYMPHS PCT: 12 %
Lymphs Abs: 1.2 10*3/uL (ref 1.0–3.6)
MCH: 31.7 pg (ref 26.0–34.0)
MCHC: 36 g/dL (ref 32.0–36.0)
MCV: 88.2 fL (ref 80.0–100.0)
MONO ABS: 0.8 10*3/uL (ref 0.2–1.0)
Monocytes Relative: 8 %
Neutro Abs: 8.2 10*3/uL — ABNORMAL HIGH (ref 1.4–6.5)
Neutrophils Relative %: 79 %
PLATELETS: 251 10*3/uL (ref 150–440)
RBC: 4.71 MIL/uL (ref 4.40–5.90)
RDW: 12.8 % (ref 11.5–14.5)
WBC: 10.1 10*3/uL (ref 3.8–10.6)

## 2018-06-19 LAB — SALICYLATE LEVEL: Salicylate Lvl: <7 mg/dL (ref 2.8–30.0)

## 2018-06-19 LAB — ETHANOL: Alcohol, Ethyl (B): <10 mg/dL (ref <10)

## 2018-06-19 MED ORDER — LORAZEPAM 2 MG/ML IJ SOLN
1.0000 mg | Freq: Once | INTRAMUSCULAR | Status: AC
  Administered 2018-06-19: 1 mg via INTRAVENOUS
  Filled 2018-06-19: qty 1

## 2018-06-19 MED ORDER — NICOTINE POLACRILEX 2 MG MT GUM
2.0000 mg | CHEWING_GUM | OROMUCOSAL | Status: DC | PRN
  Administered 2018-06-19: 2 mg via ORAL
  Filled 2018-06-19 (×2): qty 1

## 2018-06-19 MED ORDER — OLANZAPINE 10 MG PO TABS: 10.0000 mg | ORAL_TABLET | Freq: Every day | ORAL | 0 refills | Status: DC

## 2018-06-19 NOTE — ED Notes (Signed)
Pt dressed out. Pt belongings bag: hat, shorts, shirt, pair of shoes, lighter, 1 cigarette. Pt moved to Rm 23.

## 2018-06-19 NOTE — BH Assessment (Signed)
Assessment Note  Brian Osborn is an 27 y.o. male who presents to the ER via his father, after he asked him to bring him. Patient reports of having "bugs, worm and beetles" coming out of his skin. Patient states he believes he have a rare disease and afraid he is going to die. Patient was seen in the ER for similar presentation 05/2018. Patient admitted to using "Ice (methamphetamine)" approximately a day ago, prior to coming to the ER. When writer asked questions about his substance use, he minimized it and stated he was afraid he was going to be "looked at as a drug user and y'all say I'm just high and don't help me."  During the interview, the patient was fixated on the what he was seeing coming out of his skin. He was the only one who could see it. He was able to participate in the interview and provide appropriate answers to the questions except what related to the "bugs" coming out of his skin.   Patient denies SI/HI and AV/H.  Patient currently receive outpatient treatment from [facility]   Diagnosis: Substance Induced Psychosis  Past Medical History: History reviewed. No pertinent past medical history.  History reviewed. No pertinent surgical history.  Family History: No family history on file.  Social History:  reports that he has been smoking cigarettes. He has been smoking about 0.50 packs per day. He has never used smokeless tobacco. He reports that he drinks alcohol. He reports that he has current or past drug history. Drug: Methamphetamines.  Additional Social History:  Alcohol / Drug Use Pain Medications: SEE PTA  Prescriptions: SEE PTA  Over the Counter: SEE PTA  History of alcohol / drug use?: Yes Longest period of sobriety (when/how long): Unable to quantify Negative Consequences of Use: Personal relationships Substance #1 Name of Substance 1: "Ice" Meth  1 - Age of First Use: Unable to quantify 1 - Amount (size/oz): Unable to quantify 1 - Frequency:  Unable to quantify 1 - Duration: Unable to quantify 1 - Last Use / Amount: 06/18/2018  CIWA: CIWA-Ar BP: (!) 168/101 Pulse Rate: (!) 111 COWS:    Allergies:  Allergies  Allergen Reactions  . Bee Venom Hives    Home Medications:  (Not in a hospital admission)  OB/GYN Status:  No LMP for male patient.  General Assessment Data Location of Assessment: [facility] ED TTS Assessment: In system Is this a Tele or Face-to-Face Assessment?: Face-to-Face Is this an Initial Assessment or a Re-assessment for this encounter?: Initial Assessment Patient Accompanied by:: Parent Language Other than English: No Living Arrangements: Other (Comment)(Private Residence) What gender do you identify as?: Male Marital status: Single Pregnancy Status: No Living Arrangements: Parent Can pt return to current living arrangement?: Yes Admission Status: Involuntary Petitioner: ED Attending Is patient capable of signing voluntary admission?: Yes Referral Source: Self/Family/Friend Insurance type: No insurance  Medical Screening Exam [facility] Walk-in ONLY) Medical Exam completed: Yes  Crisis Care Plan Living Arrangements: Parent Legal Guardian: Other:(Self) Name of Psychiatrist: Reports of none Name of Therapist: Reports of none  Education Status Is patient currently in school?: No Is the patient employed, unemployed or receiving disability?: Unemployed  Risk to self with the past 6 months Suicidal Ideation: No Has patient been a risk to self within the past 6 months prior to admission? : No Suicidal Intent: No Has patient had any suicidal intent within the past 6 months prior to admission? : No Is patient at risk for suicide?: No  Suicidal Plan?: No Has patient had any suicidal plan within the past 6 months prior to admission? : No Access to Means: No What has been your use of drugs/alcohol within the last 12 months?: Meth, Cananbis & Alcohol Previous Attempts/Gestures: No How many times?:  0 Other Self Harm Risks: Active Addiction Triggers for Past Attempts: None known Intentional Self Injurious Behavior: None Family Suicide History: No Recent stressful life event(s): Other (Comment), Conflict (Comment), Loss (Comment), Financial Problems Persecutory voices/beliefs?: No Depression: Yes Depression Symptoms: Feeling worthless/self pity, Guilt, Isolating, Insomnia Substance abuse history and/or treatment for substance abuse?: Yes Suicide prevention information given to non-admitted patients: Not applicable  Risk to Others within the past 6 months Homicidal Ideation: No Does patient have any lifetime risk of violence toward others beyond the six months prior to admission? : No Thoughts of Harm to Others: No Current Homicidal Intent: No Current Homicidal Plan: No Access to Homicidal Means: No Identified Victim: Reports of none History of harm to others?: No Assessment of Violence: None Noted Violent Behavior Description: Reports of none Does patient have access to weapons?: No Criminal Charges Pending?: Yes Describe Pending Criminal Charges: shoplifting concealment goods, probation violation Does patient have a court date: Yes Is patient on probation?: No  Psychosis Hallucinations: Visual Delusions: Somatic  Mental Status Report Appearance/Hygiene: Other (Comment), Unremarkable, In scrubs Eye Contact: Good Motor Activity: Freedom of movement, Unremarkable Speech: Tangential, Unremarkable Level of Consciousness: Alert Mood: Depressed, Anxious, Helpless, Sad Affect: Appropriate to circumstance, Fearful, Frightened, Sad Anxiety Level: Minimal Thought Processes: Flight of Ideas, Circumstantial, Coherent Judgement: Partial Orientation: Person, Place, Time, Situation, Appropriate for developmental age Obsessive Compulsive Thoughts/Behaviors: Minimal  Cognitive Functioning Concentration: Decreased Memory: Recent Intact, Remote Intact Is patient IDD: No Insight:  Fair Impulse Control: Poor Appetite: Fair Have you had any weight changes? : No Change Sleep: Decreased Total Hours of Sleep: 0 Vegetative Symptoms: None  ADLScreening [facility] Assessment Services) Patient's cognitive ability adequate to safely complete daily activities?: Yes Patient able to express need for assistance with ADLs?: Yes Independently performs ADLs?: Yes (appropriate for developmental age)  Prior Inpatient Therapy Prior Inpatient Therapy: No  Prior Outpatient Therapy Prior Outpatient Therapy: No Does patient have an ACCT team?: No Does patient have Intensive In-House Services?  : No Does patient have [facility] services? : No Does patient have P4CC services?: No  ADL Screening (condition at time of admission) Patient's cognitive ability adequate to safely complete daily activities?: Yes Is the patient deaf or have difficulty hearing?: No Does the patient have difficulty seeing, even when wearing glasses/contacts?: No Does the patient have difficulty concentrating, remembering, or making decisions?: No Patient able to express need for assistance with ADLs?: Yes Does the patient have difficulty dressing or bathing?: No Independently performs ADLs?: Yes (appropriate for developmental age) Does the patient have difficulty walking or climbing stairs?: No Weakness of Legs: None Weakness of Arms/Hands: None  Home Assistive Devices/Equipment Home Assistive Devices/Equipment: None  Therapy Consults (therapy consults require a physician order) PT Evaluation Needed: No OT Evalulation Needed: No SLP Evaluation Needed: No Abuse/Neglect Assessment (Assessment to be complete while patient is alone) Abuse/Neglect Assessment Can Be Completed: Yes Physical Abuse: Denies Verbal Abuse: Denies Sexual Abuse: Denies Exploitation of patient/patient's resources: Denies Self-Neglect: Denies Values / Beliefs Cultural Requests During Hospitalization: None Spiritual Requests During  Hospitalization: None Consults Spiritual Care Consult Needed: No Social Work Consult Needed: No Merchant navy officer (For Healthcare) Does Patient Have a Medical Advance Directive?: No  Child/Adolescent Assessment Running Away Risk: Denies(Patient is an adult)  Disposition:  Disposition Initial Assessment Completed for this Encounter: Yes  On Site Evaluation by:   Reviewed with Physician:    Lilyan Gilfordalvin J. Erion Hermans MS, LCAS, LPC, NCC, CCSI Therapeutic Triage Specialist 06/19/2018 7:34 PM

## 2018-06-19 NOTE — ED Notes (Signed)
Patient states "I don't want to be committed. I know what I saw and its not my fault that y'all don't believe me. I don't see them anymore. But they were there." Patient repeatedly leaving room and requesting to go smoke cigarette. This RN and ODS officer informed patient that he would be unable able to leave ED to smoke while he was a patient. Also explained to patient that he would have to be evaluated and cleared by a psychiatrist before he was allowed to leave. Patient verbalized understanding.

## 2018-06-19 NOTE — ED Provider Notes (Signed)
[facility] Emergency Department Provider Note ____________________________________________   I have reviewed the triage vital signs and the triage nursing note.  HISTORY  Chief Complaint Rash and Psychiatric Evaluation   Historian Level 5 Caveat History Limited by altered mental status  HPI ALONTAE CHALOUX is a 27 y.o. male presenting stating that "parasites are birthing out of my hands and feet.  "  Uncertain when this started, patient denies history of psychiatric medications or psychiatric consultations, however he is a poor historian.  No corroborating information available.     History reviewed. No pertinent past medical history.  Patient Active Problem List   Diagnosis Date Noted  . Substance induced mood disorder (HCC) 01/13/2018  . Benzodiazepine abuse (HCC) 01/13/2018    History reviewed. No pertinent surgical history.  Prior to Admission medications   Medication Sig Start Date End Date Taking? Authorizing Provider  ibuprofen (ADVIL,MOTRIN) 600 MG tablet Take 1 tablet (600 mg total) by mouth every 6 (six) hours as needed for moderate pain. 02/14/18   Evon Slack, PA-C  oxyCODONE (ROXICODONE) 5 MG immediate release tablet Take 1 tablet (5 mg total) by mouth every 8 (eight) hours as needed. 02/14/18 02/14/19  Evon Slack, PA-C    Allergies  Allergen Reactions  . Bee Venom Hives    No family history on file.  Social History Social History   Tobacco Use  . Smoking status: Current Every Day Smoker    Packs/day: 0.50    Types: Cigarettes  . Smokeless tobacco: Never Used  Substance Use Topics  . Alcohol use: Yes    Comment: socially  . Drug use: Yes    Types: Methamphetamines    Comment: snorted today    Review of Systems  Eyes headache, chest pain, trouble breathing, recent illness, fevers, abdominal pain.  ____________________________________________   PHYSICAL EXAM:  VITAL SIGNS: ED Triage Vitals  Enc Vitals  Group     BP 06/19/18 1132 (!) 171/109     Pulse Rate 06/19/18 1132 (!) 121     Resp 06/19/18 1132 16     Temp 06/19/18 1132 98.4 F (36.9 C)     Temp Source 06/19/18 1132 Oral     SpO2 06/19/18 1132 95 %     Weight 06/19/18 1133 174 lb 13.2 oz (79.3 kg)     Height 06/19/18 1133 6\' 2"  (1.88 m)     Head Circumference --      Peak Flow --      Pain Score 06/19/18 1133 5     Pain Loc --      Pain Edu? --      Excl. in GC? --      Constitutional: Alert and paranoid, hallucinating.  HEENT      Head: Normocephalic and atraumatic.      Eyes: Conjunctivae are normal. Pupils equal and round.       Ears:         Nose: No congestion/rhinnorhea.      Mouth/Throat: Mucous membranes are moist.      Neck: No stridor. Cardiovascular/Chest: Normal rate, regular rhythm.  No murmurs, rubs, or gallops. Respiratory: Normal respiratory effort without tachypnea nor retractions. Breath sounds are clear and equal bilaterally. No wheezes/rales/rhonchi. Gastrointestinal: Soft. No distention, no guarding, no rebound. Nontender.    Genitourinary/rectal:Deferred Musculoskeletal: Nontender with normal range of motion in all extremities. No joint effusions.  No lower extremity tenderness.  No edema. Neurologic: No slurred speech.. No gross or focal neurologic  deficits are appreciated. Skin:  Skin is warm, dry and intact. No rash noted. Psychiatric: Paranoid, hallucinating about parasites on his skin.   ____________________________________________  LABS (pertinent positives/negatives) I, Governor Rooksebecca Jabrea Kallstrom, MD the attending physician have reviewed the labs noted below.  Labs Reviewed  COMPREHENSIVE METABOLIC PANEL - Abnormal; Notable for the following components:      Result Value   Glucose, Bld 200 (*)    BUN 25 (*)    AST 73 (*)    All other components within normal limits  URINE DRUG SCREEN, QUALITATIVE ([facility] ONLY) - Abnormal; Notable for the following components:   Amphetamines, Ur Screen POSITIVE (*)     Benzodiazepine, Ur Scrn POSITIVE (*)    All other components within normal limits  CBC WITH DIFFERENTIAL/PLATELET - Abnormal; Notable for the following components:   Neutro Abs 8.2 (*)    All other components within normal limits  ACETAMINOPHEN LEVEL - Abnormal; Notable for the following components:   Acetaminophen (Tylenol), Serum <10 (*)    All other components within normal limits  ETHANOL  SALICYLATE LEVEL    ____________________________________________    EKG I, Governor Rooksebecca Taye Cato, MD, the attending physician have personally viewed and interpreted all ECGs.  None ____________________________________________  RADIOLOGY   None __________________________________________  PROCEDURES  Procedure(s) performed: None  Procedures  Critical Care performed: None   ____________________________________________  ED COURSE / ASSESSMENT AND PLAN  Pertinent labs & imaging results that were available during my care of the patient were reviewed by me and considered in my medical decision making (see chart for details).     Patient is hallucinating, suspect substance abuse per patient does not tell me such.  Spoke with Jerilynn Somalvin of TTS who patient stated that he did "ice.  "  Elevated blood pressure here but no additional symptoms.  Awaiting psychiatric consultation.  Patient care transferred to oncoming physician Dr. Sharma CovertNorman at shift change 4:30 PM.    CONSULTATIONS:   TTS and psychiatry pending.   Patient / Family / Caregiver informed of clinical course, medical decision-making process, and agree with plan.    ___________________________________________   FINAL CLINICAL IMPRESSION(S) / ED DIAGNOSES   Final diagnoses:  Psychosis, unspecified psychosis type (HCC)      ___________________________________________         Note: This dictation was prepared with Dragon dictation. Any transcriptional errors that result from this process are unintentional     Governor RooksLord, Dominique Calvey, MD 06/19/18 1637

## 2018-06-19 NOTE — ED Notes (Signed)
Security officer made aware of patients IVC status.

## 2018-06-19 NOTE — ED Triage Notes (Signed)
Patient says he has bugs.  He actually is seeing beetles come out of his skin now.  He says "can you see that?"  Then looks at his knees and says he has worms crawling under his skin.  Says he does not want to get committed again.  Then says he is about to have a panic attack..  I turned on the tv and asked him to concentrate on that.

## 2018-06-19 NOTE — Consult Note (Signed)
[facility] Consult   Reason for Consult: Consult for this 27 year old man who came into the hospital claiming that he had parasites under his skin Referring Physician: Mariea Clonts Patient Identification: Brian Osborn MRN:  [id number] Principal Diagnosis: Amphetamine and psychostimulant-induced psychosis with delusions [facility]) Diagnosis:   Patient Active Problem List   Diagnosis Date Noted  . Amphetamine and psychostimulant-induced psychosis with delusions ([place]) [F15.950] 06/19/2018  . Amphetamine abuse ([place]) [F15.10] 06/19/2018  . Substance induced mood disorder ([place]) [F19.94] 01/13/2018  . Benzodiazepine abuse ([place]) [F13.10] 01/13/2018    Total Time spent with patient: 1 hour  Subjective:   Brian Osborn is a 27 y.o. male patient admitted with "you know you see them".  HPI: Patient seen chart reviewed.  Case reviewed with emergency room doctor.  27 year old man with a history of substance abuse came to the emergency room complaining that he had mites or scabies or some kind of worm or something like that crawling on and under his skin.  Patient is insistent about this no matter how many times various examiners tell him that they cannot see what he is pointing at.  Patient admits he has not been sleeping very well.  Mood has been anxious.  He has no insight into this being a hallucination or delusion.  He denies any suicidal or homicidal thought.  Denies any intention to harm himself.  Patient admits that he has been using amphetamines.  He denies that he has been using meth and instead seems to suggest that he has been using some kind of prescription pill formulation that he has been getting.  Possibly dextroamphetamine.  Also admits to drinking.  Drug screen also positive for benzodiazepines.  Not engaged in any mental health treatment.  Social history: Lives with his father.  Works only part-time [employer] or that sort of thing.  Medical history: No significant medical  problems  Substance abuse history: Patient has an identified problem with drug abuse.  Has been to the emergency room previously with the same kind of symptoms with positive drug screens.  Has no insight into it and no intention of getting any treatment.  Past Psychiatric History: No history of hospitalization no history of suicide attempts no history of violence.  Risk to Self:   Risk to Others:   Prior Inpatient Therapy:   Prior Outpatient Therapy:    Past Medical History: History reviewed. No pertinent past medical history. History reviewed. No pertinent surgical history. Family History: No family history on file. Family Psychiatric  History: Unknown Social History:  Social History   Substance and Sexual Activity  Alcohol Use Yes   Comment: socially     Social History   Substance and Sexual Activity  Drug Use Yes  . Types: Methamphetamines   Comment: snorted today    Social History   Socioeconomic History  . Marital status: Single    Spouse name: Not on file  . Number of children: Not on file  . Years of education: Not on file  . Highest education level: Not on file  Occupational History  . Not on file  Social Needs  . Financial resource strain: Not on file  . Food insecurity:    Worry: Not on file    Inability: Not on file  . Transportation needs:    Medical: Not on file    Non-medical: Not on file  Tobacco Use  . Smoking status: Current Every Day Smoker    Packs/day: 0.50  Types: Cigarettes  . Smokeless tobacco: Never Used  Substance and Sexual Activity  . Alcohol use: Yes    Comment: socially  . Drug use: Yes    Types: Methamphetamines    Comment: snorted today  . Sexual activity: Yes  Lifestyle  . Physical activity:    Days per week: Not on file    Minutes per session: Not on file  . Stress: Not on file  Relationships  . Social connections:    Talks on phone: Not on file    Gets together: Not on file    Attends religious service: Not on file     Active member of club or organization: Not on file    Attends meetings of clubs or organizations: Not on file    Relationship status: Not on file  Other Topics Concern  . Not on file  Social History Narrative  . Not on file   Additional Social History:    Allergies:   Allergies  Allergen Reactions  . Bee Venom Hives    Labs:  Results for orders placed or performed during the hospital encounter of 06/19/18 (from the past 48 hour(s))  Comprehensive metabolic panel     Status: Abnormal   Collection Time: 06/19/18 11:47 AM  Result Value Ref Range   Sodium 137 135 - 145 mmol/L   Potassium 3.6 3.5 - 5.1 mmol/L   Chloride 101 98 - 111 mmol/L   CO2 26 22 - 32 mmol/L   Glucose, Bld 200 (H) 70 - 99 mg/dL   BUN 25 (H) 6 - 20 mg/dL   Creatinine, Ser 1.08 0.61 - 1.24 mg/dL   Calcium 9.4 8.9 - 10.3 mg/dL   Total Protein 8.1 6.5 - 8.1 g/dL   Albumin 5.0 3.5 - 5.0 g/dL   AST 73 (H) 15 - 41 U/L   ALT 36 0 - 44 U/L   Alkaline Phosphatase 79 38 - 126 U/L   Total Bilirubin 0.9 0.3 - 1.2 mg/dL   GFR calc non Af Amer >60 >60 mL/min   GFR calc Af Amer >60 >60 mL/min    Comment: (NOTE) The eGFR has been calculated using the CKD EPI equation. This calculation has not been validated in all clinical situations. eGFR's persistently <60 mL/min signify possible Chronic Kidney Disease.    Anion gap 10 5 - 15    Comment: Performed at [facility], [address] [place], [place] [address]  Ethanol     Status: None   Collection Time: 06/19/18 11:47 AM  Result Value Ref Range   Alcohol, Ethyl (B) <10 <10 mg/dL    Comment: (NOTE) Lowest detectable limit for serum alcohol is 10 mg/dL. For medical purposes only. Performed at [facility], [address] [place], [place] [address]   Urine Drug Screen, Qualitative     Status: Abnormal   Collection Time: 06/19/18 11:47 AM  Result Value Ref Range   Tricyclic, Ur Screen NONE DETECTED NONE DETECTED   Amphetamines, Ur Screen  POSITIVE (A) NONE DETECTED   MDMA (Ecstasy)Ur Screen NONE DETECTED NONE DETECTED   Cocaine Metabolite,Ur [place] NONE DETECTED NONE DETECTED   Opiate, Ur Screen NONE DETECTED NONE DETECTED   Phencyclidine (PCP) Ur S NONE DETECTED NONE DETECTED   Cannabinoid 50 Ng, Ur [place] NONE DETECTED NONE DETECTED   Barbiturates, Ur Screen NONE DETECTED NONE DETECTED   Benzodiazepine, Ur Scrn POSITIVE (A) NONE DETECTED   Methadone Scn, Ur NONE DETECTED NONE DETECTED    Comment: (NOTE)  Tricyclics + metabolites, urine    Cutoff 1000 ng/mL Amphetamines + metabolites, urine  Cutoff 1000 ng/mL MDMA (Ecstasy), urine              Cutoff 500 ng/mL Cocaine Metabolite, urine          Cutoff 300 ng/mL Opiate + metabolites, urine        Cutoff 300 ng/mL Phencyclidine (PCP), urine         Cutoff 25 ng/mL Cannabinoid, urine                 Cutoff 50 ng/mL Barbiturates + metabolites, urine  Cutoff 200 ng/mL Benzodiazepine, urine              Cutoff 200 ng/mL Methadone, urine                   Cutoff 300 ng/mL The urine drug screen provides only a preliminary, unconfirmed analytical test result and should not be used for non-medical purposes. Clinical consideration and professional judgment should be applied to any positive drug screen result due to possible interfering substances. A more specific alternate chemical method must be used in order to obtain a confirmed analytical result. Gas chromatography / mass spectrometry (GC/MS) is the preferred confirmat ory method. Performed at [facility], [address] [place], [place] [address]   CBC with Diff     Status: Abnormal   Collection Time: 06/19/18 11:47 AM  Result Value Ref Range   WBC 10.1 3.8 - 10.6 K/uL   RBC 4.71 4.40 - 5.90 MIL/uL   Hemoglobin 14.9 13.0 - 18.0 g/dL    Comment: RESULT REPEATED AND VERIFIED   HCT 41.5 40.0 - 52.0 %    Comment: RESULT REPEATED AND VERIFIED   MCV 88.2 80.0 - 100.0 fL   MCH 31.7 26.0 - 34.0 pg   MCHC 36.0 32.0 -  36.0 g/dL   RDW 12.8 11.5 - 14.5 %   Platelets 251 150 - 440 K/uL   Neutrophils Relative % 79 %   Neutro Abs 8.2 (H) 1.4 - 6.5 K/uL   Lymphocytes Relative 12 %   Lymphs Abs 1.2 1.0 - 3.6 K/uL   Monocytes Relative 8 %   Monocytes Absolute 0.8 0.2 - 1.0 K/uL   Eosinophils Relative 0 %   Eosinophils Absolute 0.0 0 - 0.7 K/uL   Basophils Relative 1 %   Basophils Absolute 0.1 0 - 0.1 K/uL    Comment: Performed at [facility], [address] [place], [place] [address]  Acetaminophen level     Status: Abnormal   Collection Time: 06/19/18 11:47 AM  Result Value Ref Range   Acetaminophen (Tylenol), Serum <10 (L) 10 - 30 ug/mL    Comment: (NOTE) Therapeutic concentrations vary significantly. A range of 10-30 ug/mL  may be an effective concentration for many patients. However, some  are best treated at concentrations outside of this range. Acetaminophen concentrations >150 ug/mL at 4 hours after ingestion  and >50 ug/mL at 12 hours after ingestion are often associated with  toxic reactions. Performed at [facility], [address] [place], [place] [address]   Salicylate level     Status: None   Collection Time: 06/19/18 11:47 AM  Result Value Ref Range   Salicylate Lvl <0.3 2.8 - 30.0 mg/dL    Comment: Performed at [facility], [address] [place], [place] [address]    Current Facility-Administered Medications  Medication Dose Route Frequency Provider Last Rate  Last Dose  . nicotine polacrilex (NICORETTE) gum 2 mg  2 mg Oral PRN Lisa Roca, MD   2 mg at 06/19/18 1257   Current Outpatient Medications  Medication Sig Dispense Refill  . ibuprofen (ADVIL,MOTRIN) 600 MG tablet Take 1 tablet (600 mg total) by mouth every 6 (six) hours as needed for moderate pain. 30 tablet 0  . OLANZapine (ZYPREXA) 10 MG tablet Take 1 tablet (10 mg total) by mouth at bedtime for 3 days. 3 tablet 0  . oxyCODONE (ROXICODONE) 5 MG immediate release tablet Take 1 tablet  (5 mg total) by mouth every 8 (eight) hours as needed. 4 tablet 0    Musculoskeletal: Strength & Muscle Tone: within normal limits Gait & Station: normal Patient leans: N/A  Psychiatric Specialty Exam: Physical Exam  Nursing note and vitals reviewed. Constitutional: He appears well-developed and well-nourished.  HENT:  Head: Normocephalic and atraumatic.  Eyes: Pupils are equal, round, and reactive to light. Conjunctivae are normal.  Neck: Normal range of motion.  Cardiovascular: Regular rhythm and normal heart sounds.  Respiratory: Effort normal. No respiratory distress.  GI: Soft.  Musculoskeletal: Normal range of motion.  Neurological: He is alert.  Skin: Skin is warm and dry.  Psychiatric: His mood appears anxious. His affect is angry and labile. His speech is rapid and/or pressured and tangential. He is agitated. He is not aggressive. Thought content is paranoid and delusional. Cognition and memory are impaired. He expresses impulsivity and inappropriate judgment. He expresses no homicidal and no suicidal ideation.    Review of Systems  Constitutional: Negative.   HENT: Negative.   Eyes: Negative.   Respiratory: Negative.   Cardiovascular: Negative.   Gastrointestinal: Negative.   Musculoskeletal: Negative.   Skin: Negative.        Patient believes that he has insects or some other kind of tiny animal crawling over his skin and under his skin.  It is not even clear to me whether it itches since he is not exactly scratching so much as poking at it  Neurological: Negative.   Psychiatric/Behavioral: Positive for hallucinations and substance abuse. Negative for depression, memory loss and suicidal ideas. The patient is nervous/anxious and has insomnia.     Blood pressure (!) 171/109, pulse (!) 121, temperature 98.4 F (36.9 C), temperature source Oral, resp. rate 16, height 6' 2"  (1.88 m), weight 79.3 kg, SpO2 95 %.Body mass index is 22.45 kg/m.  General Appearance: Casual   Eye Contact:  Fair  Speech:  Pressured  Volume:  Increased  Mood:  Irritable  Affect:  Inappropriate and Labile  Thought Process:  Disorganized  Orientation:  Full (Time, Place, and Person)  Thought Content:  Illogical, Delusions and Hallucinations: Tactile Visual  Suicidal Thoughts:  No  Homicidal Thoughts:  No  Memory:  Immediate;   Fair Recent;   Fair Remote;   Fair  Judgement:  Poor  Insight:  Lacking  Psychomotor Activity:  Restlessness  Concentration:  Concentration: Fair  Recall:  [employer] of Knowledge:  Fair  Language:  Fair  Akathisia:  No  Handed:  Right  AIMS (if indicated):     Assets:  Desire for Improvement Housing Physical Health  ADL's:  Intact  Cognition:  WNL  Sleep:        Treatment Plan Summary: Medication management and Plan 27 year old man presents with visual and tactile hallucinations and delusions of parasitosis.  Most likely explanation by far would be amphetamine intoxication.  Patient denies suicidal or homicidal ideation.  Although he is picking at his skin there is no evidence that he is done anything grossly dangerous and he denies any intention to do so.  Tried to do some psychoeducation with heat which she completely resists.  Patient does not require hospitalization.  Strongly suggested to them that he discontinue drug abuse.  I have printed out a prescription for olanzapine 10 mg once at night for 3 days to try and get him some sleep and calm him down.  Case reviewed with emergency room doctor.  Disposition: No evidence of imminent risk to self or others at present.   Patient does not meet criteria for psychiatric inpatient admission. Supportive therapy provided about ongoing stressors.  Alethia Berthold, MD 06/19/2018 6:01 PM

## 2018-06-19 NOTE — Discharge Instructions (Addendum)
Return to the emergency department if you develop thoughts of hurting yourself or anyone else, hallucinations, or any other symptoms concerning to you. °

## 2018-09-11 ENCOUNTER — Other Ambulatory Visit: Payer: Self-pay

## 2018-09-11 ENCOUNTER — Inpatient Hospital Stay
Admission: EM | Admit: 2018-09-11 | Discharge: 2018-09-12 | DRG: 683 | Disposition: A | Discharge status: Home / Self Care | Payer: Self-pay | Attending: Internal Medicine | Admitting: Internal Medicine

## 2018-09-11 ENCOUNTER — Encounter: Payer: Self-pay | Admitting: Emergency Medicine

## 2018-09-11 DIAGNOSIS — R21 Rash and other nonspecific skin eruption: Secondary | ICD-10-CM | POA: Diagnosis present

## 2018-09-11 DIAGNOSIS — L299 Pruritus, unspecified: Secondary | ICD-10-CM | POA: Diagnosis present

## 2018-09-11 DIAGNOSIS — F15950 Other stimulant use, unspecified with stimulant-induced psychotic disorder with delusions: Secondary | ICD-10-CM

## 2018-09-11 DIAGNOSIS — E860 Dehydration: Secondary | ICD-10-CM | POA: Diagnosis present

## 2018-09-11 DIAGNOSIS — F17210 Nicotine dependence, cigarettes, uncomplicated: Secondary | ICD-10-CM | POA: Diagnosis present

## 2018-09-11 DIAGNOSIS — Z79899 Other long term (current) drug therapy: Secondary | ICD-10-CM

## 2018-09-11 DIAGNOSIS — F15250 Other stimulant dependence with stimulant-induced psychotic disorder with delusions: Secondary | ICD-10-CM | POA: Diagnosis present

## 2018-09-11 DIAGNOSIS — N179 Acute kidney failure, unspecified: Principal | ICD-10-CM | POA: Diagnosis present

## 2018-09-11 DIAGNOSIS — M6282 Rhabdomyolysis: Secondary | ICD-10-CM | POA: Diagnosis present

## 2018-09-11 DIAGNOSIS — F1510 Other stimulant abuse, uncomplicated: Secondary | ICD-10-CM | POA: Diagnosis present

## 2018-09-11 LAB — CBC WITH DIFFERENTIAL/PLATELET
Abs Immature Granulocytes: 0.02 10*3/uL (ref 0.00–0.07)
Basophils Absolute: 0.1 10*3/uL (ref 0.0–0.1)
Basophils Relative: 1 %
EOS ABS: 0 10*3/uL (ref 0.0–0.5)
EOS PCT: 1 %
HCT: 39.1 % (ref 39.0–52.0)
Hemoglobin: 13.8 g/dL (ref 13.0–17.0)
Immature Granulocytes: 0 %
Lymphocytes Relative: 10 %
Lymphs Abs: 0.7 10*3/uL (ref 0.7–4.0)
MCH: 30.4 pg (ref 26.0–34.0)
MCHC: 35.3 g/dL (ref 30.0–36.0)
MCV: 86.1 fL (ref 80.0–100.0)
MONO ABS: 0.8 10*3/uL (ref 0.1–1.0)
Monocytes Relative: 12 %
Neutro Abs: 5 10*3/uL (ref 1.7–7.7)
Neutrophils Relative %: 76 %
Platelets: 158 10*3/uL (ref 150–400)
RBC: 4.54 MIL/uL (ref 4.22–5.81)
RDW: 11.7 % (ref 11.5–15.5)
WBC: 6.6 10*3/uL (ref 4.0–10.5)
nRBC: 0 % (ref 0.0–0.2)

## 2018-09-11 LAB — MAGNESIUM: Magnesium: 2.2 mg/dL (ref 1.7–2.4)

## 2018-09-11 LAB — CHLAMYDIA/NGC RT PCR (ARMC ONLY)
Chlamydia Tr: NOT DETECTED
N gonorrhoeae: NOT DETECTED

## 2018-09-11 LAB — ACETAMINOPHEN LEVEL: Acetaminophen (Tylenol), Serum: <10 ug/mL — ABNORMAL LOW (ref 10–30)

## 2018-09-11 LAB — COMPREHENSIVE METABOLIC PANEL
ALT: 38 U/L (ref 0–44)
AST: 72 U/L — ABNORMAL HIGH (ref 15–41)
Albumin: 5 g/dL (ref 3.5–5.0)
Alkaline Phosphatase: 64 U/L (ref 38–126)
Anion gap: 14 (ref 5–15)
BUN: 33 mg/dL — ABNORMAL HIGH (ref 6–20)
CALCIUM: 9.5 mg/dL (ref 8.9–10.3)
CO2: 24 mmol/L (ref 22–32)
CREATININE: 1.43 mg/dL — AB (ref 0.61–1.24)
Chloride: 104 mmol/L (ref 98–111)
GFR calc Af Amer: >60 mL/min (ref >60)
Glucose, Bld: 91 mg/dL (ref 70–99)
Potassium: 3.7 mmol/L (ref 3.5–5.1)
Sodium: 142 mmol/L (ref 135–145)
Total Bilirubin: 1.1 mg/dL (ref 0.3–1.2)
Total Protein: 7.7 g/dL (ref 6.5–8.1)

## 2018-09-11 LAB — URINE DRUG SCREEN, QUALITATIVE (ARMC ONLY)
Amphetamines, Ur Screen: POSITIVE — AB
Barbiturates, Ur Screen: NOT DETECTED
Benzodiazepine, Ur Scrn: POSITIVE — AB
Cannabinoid 50 Ng, Ur ~~LOC~~: NOT DETECTED
Cocaine Metabolite,Ur ~~LOC~~: POSITIVE — AB
MDMA (Ecstasy)Ur Screen: NOT DETECTED
Methadone Scn, Ur: NOT DETECTED
Opiate, Ur Screen: NOT DETECTED
Phencyclidine (PCP) Ur S: NOT DETECTED
Tricyclic, Ur Screen: NOT DETECTED

## 2018-09-11 LAB — ETHANOL: Alcohol, Ethyl (B): <10 mg/dL (ref <10)

## 2018-09-11 LAB — CREATININE, URINE, RANDOM: Creatinine, Urine: 399 mg/dL

## 2018-09-11 LAB — NA AND K (SODIUM & POTASSIUM), RAND UR
Potassium Urine: 74 mmol/L
Sodium, Ur: 29 mmol/L

## 2018-09-11 LAB — CK: Total CK: 1613 U/L — ABNORMAL HIGH (ref 49–397)

## 2018-09-11 LAB — SALICYLATE LEVEL: Salicylate Lvl: <7 mg/dL (ref 2.8–30.0)

## 2018-09-11 LAB — PROTEIN, URINE, RANDOM: TOTAL PROTEIN, URINE: 52 mg/dL

## 2018-09-11 LAB — PHOSPHORUS: Phosphorus: 4.5 mg/dL (ref 2.5–4.6)

## 2018-09-11 MED ORDER — INFLUENZA VAC SPLIT QUAD 0.5 ML IM SUSY
0.5000 mL | PREFILLED_SYRINGE | INTRAMUSCULAR | Status: DC
  Filled 2018-09-11: qty 0.5

## 2018-09-11 MED ORDER — BISACODYL 5 MG PO TBEC: 5.0000 mg | DELAYED_RELEASE_TABLET | Freq: Every day | ORAL | Status: DC | PRN

## 2018-09-11 MED ORDER — LORAZEPAM 2 MG/ML IJ SOLN
2.0000 mg | Freq: Once | INTRAMUSCULAR | Status: AC
  Administered 2018-09-11: 2 mg via INTRAVENOUS
  Filled 2018-09-11: qty 1

## 2018-09-11 MED ORDER — PENICILLIN G BENZATHINE 1200000 UNIT/2ML IM SUSP
2.4000 10*6.[IU] | Freq: Once | INTRAMUSCULAR | Status: AC
  Administered 2018-09-11: 2.4 10*6.[IU] via INTRAMUSCULAR
  Filled 2018-09-11: qty 4

## 2018-09-11 MED ORDER — MIDAZOLAM HCL 2 MG/2ML IJ SOLN
10.0000 mg | Freq: Once | INTRAMUSCULAR | Status: DC
  Filled 2018-09-11: qty 10

## 2018-09-11 MED ORDER — MIDAZOLAM HCL 5 MG/ML IJ SOLN
5.0000 mg | Freq: Once | INTRAMUSCULAR | Status: AC
  Administered 2018-09-11: 5 mg via INTRAMUSCULAR
  Filled 2018-09-11: qty 1

## 2018-09-11 MED ORDER — SENNOSIDES-DOCUSATE SODIUM 8.6-50 MG PO TABS: 1.0000 | ORAL_TABLET | Freq: Every evening | ORAL | Status: DC | PRN

## 2018-09-11 MED ORDER — ONDANSETRON HCL 4 MG/2ML IJ SOLN: 4.0000 mg | Freq: Four times a day (QID) | INTRAMUSCULAR | Status: DC | PRN

## 2018-09-11 MED ORDER — ENOXAPARIN SODIUM 40 MG/0.4ML ~~LOC~~ SOLN: 40.0000 mg | SUBCUTANEOUS | Status: DC

## 2018-09-11 MED ORDER — DIPHENHYDRAMINE HCL 25 MG PO CAPS: 25.0000 mg | ORAL_CAPSULE | ORAL | Status: DC | PRN

## 2018-09-11 MED ORDER — ACETAMINOPHEN 325 MG PO TABS: 650.0000 mg | ORAL_TABLET | Freq: Four times a day (QID) | ORAL | Status: DC | PRN

## 2018-09-11 MED ORDER — SODIUM CHLORIDE 0.9 % IV BOLUS
2000.0000 mL | Freq: Once | INTRAVENOUS | Status: AC
  Administered 2018-09-11: 2000 mL via INTRAVENOUS

## 2018-09-11 MED ORDER — HALOPERIDOL LACTATE 5 MG/ML IJ SOLN: 5.0000 mg | Freq: Once | INTRAMUSCULAR | Status: DC

## 2018-09-11 MED ORDER — BUPRENORPHINE HCL-NALOXONE HCL 2-0.5 MG SL SUBL
6.0000 | SUBLINGUAL_TABLET | Freq: Every day | SUBLINGUAL | Status: DC
  Administered 2018-09-11 – 2018-09-12 (×2): 6 via SUBLINGUAL
  Filled 2018-09-11 (×2): qty 6

## 2018-09-11 MED ORDER — ACETAMINOPHEN 650 MG RE SUPP: 650.0000 mg | Freq: Four times a day (QID) | RECTAL | Status: DC | PRN

## 2018-09-11 MED ORDER — ONDANSETRON HCL 4 MG PO TABS: 4.0000 mg | ORAL_TABLET | Freq: Four times a day (QID) | ORAL | Status: DC | PRN

## 2018-09-11 MED ORDER — LACTATED RINGERS IV SOLN
INTRAVENOUS | Status: AC
  Administered 2018-09-11: 06:00:00 via INTRAVENOUS

## 2018-09-11 NOTE — ED Notes (Signed)
Hourly rounding reveals patient in room. No complaints, stable, in no acute distress. Q15 minute rounds and monitoring via Rover and Officer to continue.   

## 2018-09-11 NOTE — ED Triage Notes (Addendum)
Patient ambulatory to triage with steady gait, without difficulty; pt very talkative, anxious and restless; Pt st I got a new dresser and I feel like there are bugs under my skin; c/o "itching and burning"; st was here for similar before and "was under the influence but not under influence now"; pt is walking around lobby going from person to person asking for money; [place] PD officer over to speak with pt; pt escorted to [facility] for further evaluation

## 2018-09-11 NOTE — Progress Notes (Signed)
The patient has no complaints.  He denies any depression, anxiety or suicidal ideation. Vital signs are stable.  Physical examinations is unremarkable. Acute renal failure and rhabdomyolysis.  Continue IV fluid support and follow-up BMP and CK. Tobacco abuse.  Smoking cessation was counseled for 3 to 4 minutes. Substance abuse.  Counseled for cessation. Discontinued IVC. I discussed with RN. Time spent about 26 minutes.

## 2018-09-11 NOTE — ED Notes (Signed)
Pt dressed into behavioral scrubs by this RN. Belongings include 1 gray sweatshirt, 1 gray t-shirt, 1 pr plaid boxer briefs, 1 pr white socks, 1 cell phone, and 1 pr boots. Pt also had a bottle of r/x medication that he tried to hide that is labeled with his name, but contained 2 different colored/size pills, along with a small baggie of white powder. Bottle taken from pt and given to primary RN, Sue LushAndrea. Sue Lushndrea notifying pharmacy at this time to attempt to assist in identification of medications, and to obtain directions for safekeeping and/or proper disposal as required.

## 2018-09-11 NOTE — ED Provider Notes (Signed)
[facility] Emergency Department Provider Note  ____________________________________________   First MD Initiated Contact with Patient 09/11/18 0126     (approximate)  I have reviewed the triage vital signs and the nursing notes.   HISTORY  Chief Complaint Rash  Level 5 exemption history limited by the patient's active psychosis  HPI Brian Osborn is a 27 y.o. male self presents to the emergency department stating he feels there are needles that are living inside his skin and erupting through.  He says that he has previously used illicit drugs and has been here for that in the past but that is not what is going on today.  His symptoms seem to come on suddenly and are severe.  He said he got a new piece of furniture and he saw the bugs coming out of the furniture crawling into his skin and they are erupting through his skin.    History reviewed. No pertinent past medical history.  Patient Active Problem List   Diagnosis Date Noted  . 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 Substance induced mood disorder (HCC) 01/13/2018  . Benzodiazepine abuse (HCC) 01/13/2018    History reviewed. No pertinent surgical history.  Prior to Admission medications   Medication Sig Start Date End Date Taking? Authorizing Provider  ibuprofen (ADVIL,MOTRIN) 600 MG tablet Take 1 tablet (600 mg total) by mouth every 6 (six) hours as needed for moderate pain. 02/14/18   Evon SlackGaines, Thomas C, PA-C  OLANZapine (ZYPREXA) 10 MG tablet Take 1 tablet (10 mg total) by mouth at bedtime for 3 days. 06/19/18 06/22/18  Clapacs, Jackquline DenmarkJohn T, MD  oxyCODONE (ROXICODONE) 5 MG immediate release tablet Take 1 tablet (5 mg total) by mouth every 8 (eight) hours as needed. 02/14/18 02/14/19  Evon SlackGaines, Thomas C, PA-C    Allergies Bee venom  History reviewed. No pertinent family  history.  Social History Social History   Tobacco Use  . Smoking status: Current Every Day Smoker    Packs/day: 0.50    Types: Cigarettes  . Smokeless tobacco: Never Used  Substance Use Topics  . Alcohol use: Yes    Comment: socially  . Drug use: Yes    Types: Methamphetamines    Comment: snorted today    Review of Systems Level 5 exemption history is limited by the patient's intoxication and psychosis  ____________________________________________   PHYSICAL EXAM:  VITAL SIGNS: ED Triage Vitals [09/11/18 0119]  Enc Vitals Group     BP      Pulse      Resp      Temp      Temp src      SpO2      Weight 170 lb (77.1 kg)     Height 6\' 2"  (1.88 m)     Head Circumference      Peak Flow      Pain Score 0     Pain Loc      Pain Edu?      Excl. in GC?     Constitutional: Anxious appearing fidgeting wringing his hands pacing up and down the hallway muttering to himself and responding to internal stimuli Eyes: PERRL EOMI. dilated and brisk Head: Atraumatic. Nose: No congestion/rhinnorhea. Mouth/Throat: No trismus Neck: No stridor.   Cardiovascular: Tachycardic rate, regular rhythm. Grossly normal heart sounds.  Good peripheral circulation. Respiratory: Increased respiratory effort.  No retractions. Lungs CTAB and  moving good air Gastrointestinal: Soft nontender Musculoskeletal: No lower extremity edema   Neurologic: Moves all 4 extremities.  He does have psychomotor agitation Skin: He has multiple lesions and excoriations across his body including on his right palm small pustular lesion Psychiatric: Appears to be under the influence of amphetamines    ____________________________________________   DIFFERENTIAL includes but not limited to  Amphetamine psychosis, schizophrenia, syphilis, HIV, gonococcal disease ____________________________________________   LABS (all labs ordered are listed, but only abnormal results are displayed)  Labs Reviewed   ACETAMINOPHEN LEVEL - Abnormal; Notable for the following components:      Result Value   Acetaminophen (Tylenol), Serum <10 (*)    All other components within normal limits  COMPREHENSIVE METABOLIC PANEL - Abnormal; Notable for the following components:   BUN 33 (*)    Creatinine, Ser 1.43 (*)    AST 72 (*)    All other components within normal limits  URINE DRUG SCREEN, QUALITATIVE ([facility] ONLY) - Abnormal; Notable for the following components:   Amphetamines, Ur Screen POSITIVE (*)    Cocaine Metabolite,Ur [place] POSITIVE (*)    Benzodiazepine, Ur Scrn POSITIVE (*)    All other components within normal limits  CK - Abnormal; Notable for the following components:   Total CK 1,613 (*)    All other components within normal limits  CHLAMYDIA/NGC RT PCR ([facility] ONLY)  ETHANOL  CBC WITH DIFFERENTIAL/PLATELET  SALICYLATE LEVEL  MAGNESIUM  PHOSPHORUS  RPR  HIV ANTIBODY (ROUTINE TESTING W REFLEX)  UREA NITROGEN, URINE  NA AND K (SODIUM & POTASSIUM), RAND UR  CREATININE, URINE, RANDOM  PROTEIN, URINE, RANDOM    Lab work reviewed by me shows acute kidney injury with elevated CK consistent with rhabdomyolysis.  His drug screen is positive for amphetamines and cocaine.  The benzodiazepines could be secondary to the benzos that we gave him in the emergency department. __________________________________________  EKG   ____________________________________________  RADIOLOGY   ____________________________________________   PROCEDURES  Procedure(s) performed: no  Procedures  Critical Care performed: no  ____________________________________________   INITIAL IMPRESSION / ASSESSMENT AND PLAN / ED COURSE  Pertinent labs & imaging results that were available during my care of the patient were reviewed by me and considered in my medical decision making (see chart for details).   As part of my medical decision making, I reviewed the following data within the electronic  MEDICAL RECORD NUMBER History obtained from family if available, nursing notes, old chart and ekg, as well as notes from prior ED visits.  The patient comes to the emergency department with formication and is responding to internal stimuli.  His constellation of symptoms are consistent with amphetamine induced psychosis.  I am giving him 5 mg of intramuscular midazolam  to help counteract the effects of the stimulant and will check broad labs including a CK.  I am also placing the patient under involuntary commitment as he is clearly a danger to himself.  The patient's creatinine is nearly double his baseline his CK is elevated consistent with amphetamine induced rhabdomyolysis and acute kidney injury.  At this point he will require aggressive IV fluids and inpatient admission to ensure resolution of his acute metabolic abnormality.  Discussed with the hospitalist who verbalizes understanding and agreement with the plan.      ____________________________________________   FINAL CLINICAL IMPRESSION(S) / ED DIAGNOSES  Final diagnoses:  Non-traumatic rhabdomyolysis  Acute kidney injury (HCC)  Amphetamine and psychostimulant-induced psychosis with delusions (HCC)  NEW MEDICATIONS STARTED DURING THIS VISIT:  Current Discharge Medication List       Note:  This document was prepared using Dragon voice recognition software and may include unintentional dictation errors.     Merrily Brittle, MD 09/11/18 [phone]

## 2018-09-11 NOTE — ED Notes (Signed)
Pt. Transferred from Triage to room after dressing out and screening for contraband. Report to include Situation, Background, Assessment and Recommendations from triage RN. Pt. Oriented to Quad including Q15 minute rounds as well as Rover and Officer for their protection. Patient is alert and oriented, warm and dry in no acute distress. Patient denies SI, HI, and AVH. Pt. Encouraged to let me know if needs arise.   

## 2018-09-11 NOTE — H&P (Signed)
[facility] [place] at [facility]   PATIENT NAME: Brian Osborn    MR#:  [id number]  DATE OF BIRTH:  07-20-1991  DATE OF ADMISSION:  09/11/2018  PRIMARY CARE PHYSICIAN: Patient, No Pcp Per   REQUESTING/REFERRING PHYSICIAN: Merrily Brittle, MD  CHIEF COMPLAINT:   Chief Complaint  Patient presents with  . Rash    HISTORY OF PRESENT ILLNESS:  Brian Osborn  is a 27 y.o. male with a known history of methamphetamine abuse/dependence p/w pruritis, rash. When he arrived to the ED, he had complained of "beetles everywhere". He tells me that he reached into a drawer to take out a shirt, and got bitten on his hands by ants. He also says that a beetle crawled out of his right shin and turned into a worm. He says he lives in an apartment nearby to the hospital. He states he has had rash and itching of his hands and forearms for 2.5d. He denies fever/diaphoresis. He is AAOx3, but is sleepy, and is not able to provide any other pertinent history. He admits to methamphetamine use, and I suspect his complaints of seeing insects is related to methamphetamine use; however, pt also has what appear to be small bites on his arms and legs. I wonder if his apartment does have some sort of insect infestation (fleas/bedbugs). I do not see burrows in the interdigital spaces.  Found to be in AKI on labwork. Endorses thirst/dehydration.  PAST MEDICAL HISTORY:  History reviewed. No pertinent past medical history.  PAST SURGICAL HISTORY:  History reviewed. No pertinent surgical history.  SOCIAL HISTORY:   Social History   Tobacco Use  . Smoking status: Current Every Day Smoker    Packs/day: 0.50    Types: Cigarettes  . Smokeless tobacco: Never Used  Substance Use Topics  . Alcohol use: Yes    Comment: socially    FAMILY HISTORY:  History reviewed. No pertinent family history.  DRUG ALLERGIES:   Allergies  Allergen Reactions  . Bee Venom Hives    REVIEW OF SYSTEMS:   Review  of Systems  Constitutional: Negative for chills, diaphoresis, fever, malaise/fatigue and weight loss.  HENT: Negative for congestion, ear pain, hearing loss, nosebleeds, sinus pain, sore throat and tinnitus.   Eyes: Negative for blurred vision, double vision and photophobia.  Respiratory: Negative for cough, hemoptysis, sputum production, shortness of breath and wheezing.   Cardiovascular: Negative for chest pain, palpitations, orthopnea, claudication, leg swelling and PND.  Gastrointestinal: Negative for abdominal pain, blood in stool, constipation, diarrhea, heartburn, melena, nausea and vomiting.  Genitourinary: Negative for dysuria, frequency, hematuria and urgency.  Musculoskeletal: Negative for back pain, falls, joint pain, myalgias and neck pain.  Skin: Positive for itching and rash.  Neurological: Negative for dizziness, tingling, tremors, sensory change, speech change, focal weakness, seizures, loss of consciousness, weakness and headaches.  Psychiatric/Behavioral: Negative for memory loss. The patient does not have insomnia.    MEDICATIONS AT HOME:   Prior to Admission medications   Medication Sig Start Date End Date Taking? Authorizing Provider  ibuprofen (ADVIL,MOTRIN) 600 MG tablet Take 1 tablet (600 mg total) by mouth every 6 (six) hours as needed for moderate pain. 02/14/18   Evon Slack, PA-C  OLANZapine (ZYPREXA) 10 MG tablet Take 1 tablet (10 mg total) by mouth at bedtime for 3 days. 06/19/18 06/22/18  Clapacs, Jackquline Denmark, MD  oxyCODONE (ROXICODONE) 5 MG immediate release tablet Take 1 tablet (5 mg total) by mouth every 8 (eight) hours  as needed. 02/14/18 02/14/19  Evon SlackGaines, Thomas C, PA-C      VITAL SIGNS:  Blood pressure 121/82, pulse (!) 120, temperature 98.2 F (36.8 C), temperature source Oral, resp. rate 20, height 6\' 2"  (1.88 m), weight 77.1 kg, SpO2 96 %.  PHYSICAL EXAMINATION:  Physical Exam  Constitutional: He is oriented to person, place, and time. He appears  well-developed and well-nourished. He is active and cooperative.  Non-toxic appearance. He does not have a sickly appearance. He does not appear ill. No distress. He is not intubated.  HENT:  Head: Normocephalic and atraumatic.  Mouth/Throat: Oropharynx is clear and moist. No oropharyngeal exudate.  Eyes: Conjunctivae, EOM and lids are normal. No scleral icterus.  Neck: Neck supple. No JVD present. No thyromegaly present.  Cardiovascular: Normal rate, regular rhythm, S1 normal, S2 normal and normal heart sounds.  No extrasystoles are present. Exam reveals no gallop, no S3, no S4, no distant heart sounds and no friction rub.  No murmur heard. Pulmonary/Chest: Effort normal and breath sounds normal. No accessory muscle usage or stridor. No apnea, no tachypnea and no bradypnea. He is not intubated. No respiratory distress. He has no decreased breath sounds. He has no wheezes. He has no rhonchi. He has no rales.  Abdominal: Soft. He exhibits no distension. Bowel sounds are decreased. There is no tenderness. There is no rigidity, no rebound and no guarding.  Musculoskeletal: Normal range of motion. He exhibits no edema or tenderness.  Lymphadenopathy:    He has no cervical adenopathy.  Neurological: He is alert and oriented to person, place, and time. He is not disoriented.  Skin: Skin is warm and dry. Lesion and rash noted. Rash is papular. He is not diaphoretic. No erythema.  Psychiatric: He has a normal mood and affect. His speech is normal and behavior is normal. Judgment and thought content normal. Cognition and memory are normal.   LABORATORY PANEL:   CBC Recent Labs  Lab 09/11/18 0142  WBC 6.6  HGB 13.8  HCT 39.1  PLT 158   ------------------------------------------------------------------------------------------------------------------  Chemistries  Recent Labs  Lab 09/11/18 0142  NA 142  K 3.7  CL 104  CO2 24  GLUCOSE 91  BUN 33*  CREATININE 1.43*  CALCIUM 9.5  AST 72*    ALT 38  ALKPHOS 64  BILITOT 1.1   ------------------------------------------------------------------------------------------------------------------  Cardiac Enzymes No results for input(s): TROPONINI in the last 168 hours. ------------------------------------------------------------------------------------------------------------------  RADIOLOGY:  No results found.  IMPRESSION AND PLAN:   A/P: 33M w/ methamphetamine abuse/dependence p/w formication, pruritic rash, AKI, rhabdomyolysis. -AKI: Cr 1.43 on admission. Baseline 0.8. Likely prerenal AKI. CPK 1613, (+) rhabdomyolysis, however not high enough to be likely to have caused AKI. More likely due to dehydration. Urine studies (electrolytes, protein, creatinine, urea). IVF. Monitor BMP, avoid nephrotoxins. -Rhabdomyolysis: CPK 1613. Elevated, but I do not expect elevation is high enough to be driving pt's renal injury. IVF as above, monitor levels. -Pruritic rash: Appears to have small pruritic papules on arms and legs. Does not have burrows in the interdigital spaces. Pt's place of residence may be infested w/ insects (fleas/bedbugs), may potentially warrant home inspection and fumigation. -Methamphetamine abuse/dependence, formication: UTox (+) amphetamines. IVC in ED. -FEN/GI: Regular diet. -DVT PPx: Lovenox. -Code status: Full code. -Disposition: Admission, > 2 midnights.   All the records are reviewed and case discussed with ED provider. Management plans discussed with the patient, family and they are in agreement.  CODE STATUS: Full code.  TOTAL TIME TAKING CARE  OF THIS PATIENT: 75 minutes.    Barbaraann Rondo M.D on 09/11/2018 at 3:04 AM  Between 7am to 6pm - Pager - [phone]  After 6pm go to [email or web address] - Social research officer, government  [facility] [place] [facility]  Office  [phone]  CC: Primary care physician; Patient, No Pcp Per   Note: This dictation was prepared with Dragon dictation along with  smaller phrase technology. Any transcriptional errors that result from this process are unintentional.

## 2018-09-12 LAB — HIV ANTIBODY (ROUTINE TESTING W REFLEX): HIV SCREEN 4TH GENERATION: NONREACTIVE

## 2018-09-12 LAB — BASIC METABOLIC PANEL
Anion gap: 5 (ref 5–15)
BUN: 19 mg/dL (ref 6–20)
CO2: 29 mmol/L (ref 22–32)
Calcium: 8.7 mg/dL — ABNORMAL LOW (ref 8.9–10.3)
Chloride: 106 mmol/L (ref 98–111)
Creatinine, Ser: 0.84 mg/dL (ref 0.61–1.24)
GFR calc Af Amer: >60 mL/min (ref >60)
GFR calc non Af Amer: >60 mL/min (ref >60)
Glucose, Bld: 103 mg/dL — ABNORMAL HIGH (ref 70–99)
Potassium: 3.7 mmol/L (ref 3.5–5.1)
Sodium: 140 mmol/L (ref 135–145)

## 2018-09-12 LAB — UREA NITROGEN, URINE: UREA NITROGEN UR: 1653 mg/dL

## 2018-09-12 LAB — CK: Total CK: 1407 U/L — ABNORMAL HIGH (ref 49–397)

## 2018-09-12 LAB — RPR: RPR Ser Ql: NONREACTIVE

## 2018-09-12 MED ORDER — SODIUM CHLORIDE 0.9 % IV SOLN
INTRAVENOUS | Status: DC
  Administered 2018-09-12: 09:00:00 via INTRAVENOUS

## 2018-09-12 NOTE — Progress Notes (Signed)
Discharge instructions reviewed with patient.  Understanding was verbalized and all questions were answered.  IV removed without complication.  Patient discharged ambulatory.

## 2018-09-12 NOTE — Discharge Summary (Signed)
[facility] - [place] at [facility]   PATIENT NAME: Brian Osborn    MR#:  [id number]  DATE OF BIRTH:  10-Jul-1991  DATE OF ADMISSION:  09/11/2018   ADMITTING PHYSICIAN: Barbaraann Rondo, MD  DATE OF DISCHARGE: 09/12/2018  PRIMARY CARE PHYSICIAN: Patient, No Pcp Per   ADMISSION DIAGNOSIS:  Acute kidney injury (HCC) [N17.9] Non-traumatic rhabdomyolysis [M62.82] DISCHARGE DIAGNOSIS:  Active Problems:   Methamphetamine abuse (HCC)  SECONDARY DIAGNOSIS:  History reviewed. No pertinent past medical history. HOSPITAL COURSE:  27M w/ methamphetamine abuse/dependence p/w formication, pruritic rash, AKI, rhabdomyolysis. Acute kidney injury, improved with IV fluid support. Rhabdomyolysis.  Improving with IV fluid support, CK down to 1400, but the patient want to go home today.  Follow-up CK with PCP or walk-in clinic. Substance abuse and tobacco abuse.  The patient was counseled for cessation.  He want to quit. Pruritic rash.  Improved. DISCHARGE CONDITIONS:  Stable, discharged home today. CONSULTS OBTAINED:  Treatment Team:  Barbaraann Rondo, MD DRUG ALLERGIES:   Allergies  Allergen Reactions  . Bee Venom Hives   DISCHARGE MEDICATIONS:   Allergies as of 09/12/2018      Reactions   Bee Venom Hives      Medication List    STOP taking these medications   ibuprofen 600 MG tablet Commonly known as:  ADVIL,MOTRIN   OLANZapine 10 MG tablet Commonly known as:  ZYPREXA   oxyCODONE 5 MG immediate release tablet Commonly known as:  Oxy IR/ROXICODONE     TAKE these medications   buprenorphine-naloxone 8-2 mg Subl SL tablet Commonly known as:  SUBOXONE Place 1.5 tablets under the tongue daily.        DISCHARGE INSTRUCTIONS:  See AVS.  If you experience worsening of your admission symptoms, develop shortness of breath, life threatening emergency, suicidal or homicidal thoughts you must seek medical attention immediately by calling 911 or calling your  MD immediately  if symptoms less severe.  You Must read complete instructions/literature along with all the possible adverse reactions/side effects for all the Medicines you take and that have been prescribed to you. Take any new Medicines after you have completely understood and accpet all the possible adverse reactions/side effects.   Please note  You were cared for by a hospitalist during your hospital stay. If you have any questions about your discharge medications or the care you received while you were in the hospital after you are discharged, you can call the unit and asked to speak with the hospitalist on call if the hospitalist that took care of you is not available. Once you are discharged, your primary care physician will handle any further medical issues. Please note that NO REFILLS for any discharge medications will be authorized once you are discharged, as it is imperative that you return to your primary care physician (or establish a relationship with a primary care physician if you do not have one) for your aftercare needs so that they can reassess your need for medications and monitor your lab values.    On the day of Discharge:  VITAL SIGNS:  Blood pressure 95/62, pulse 66, temperature 97.6 F (36.4 C), temperature source Oral, resp. rate 18, height 6\' 2"  (1.88 m), weight 71.1 kg, SpO2 100 %. PHYSICAL EXAMINATION:  GENERAL:  27 y.o.-year-old patient lying in the bed with no acute distress.  EYES: Pupils equal, round, reactive to light and accommodation. No scleral icterus. Extraocular muscles intact.  HEENT: Head atraumatic, normocephalic. Oropharynx and nasopharynx clear.  NECK:  Supple, no jugular venous distention. No thyroid enlargement, no tenderness.  LUNGS: Normal breath sounds bilaterally, no wheezing, rales,rhonchi or crepitation. No use of accessory muscles of respiration.  CARDIOVASCULAR: S1, S2 normal. No murmurs, rubs, or gallops.  ABDOMEN: Soft, non-tender,  non-distended. Bowel sounds present. No organomegaly or mass.  EXTREMITIES: No pedal edema, cyanosis, or clubbing.  NEUROLOGIC: Cranial nerves II through XII are intact. Muscle strength 5/5 in all extremities. Sensation intact. Gait not checked.  PSYCHIATRIC: The patient is alert and oriented x 3.  SKIN: No obvious rash, lesion, or ulcer.  DATA REVIEW:   CBC Recent Labs  Lab 09/11/18 0142  WBC 6.6  HGB 13.8  HCT 39.1  PLT 158    Chemistries  Recent Labs  Lab 09/11/18 0142 09/11/18 0639 09/12/18 0418  NA 142  --  140  K 3.7  --  3.7  CL 104  --  106  CO2 24  --  29  GLUCOSE 91  --  103*  BUN 33*  --  19  CREATININE 1.43*  --  0.84  CALCIUM 9.5  --  8.7*  MG  --  2.2  --   AST 72*  --   --   ALT 38  --   --   ALKPHOS 64  --   --   BILITOT 1.1  --   --      Microbiology Results  Results for orders placed or performed during the hospital encounter of 09/11/18  Chlamydia/NGC rt PCR ([facility] only)     Status: None   Collection Time: 09/11/18  1:44 AM  Result Value Ref Range Status   Specimen source GC/Chlam URINE, RANDOM  Final   Chlamydia Tr NOT DETECTED NOT DETECTED Final   N gonorrhoeae NOT DETECTED NOT DETECTED Final    Comment: (NOTE) This CT/NG assay has not been evaluated in patients with a history of  hysterectomy. Performed at [facility], [address] [place], [place] [address]     RADIOLOGY:  No results found.   Management plans discussed with the patient, family and they are in agreement.  CODE STATUS: Full Code   TOTAL TIME TAKING CARE OF THIS PATIENT: 26 minutes.    Shaune PollackQing Shanette Tamargo M.D on 09/12/2018 at 12:27 PM  Between 7am to 6pm - Pager - [phone]  After 6pm go to [email or web address] - Social research officer, governmentpassword EPAS [email or web address]  [facility] [place] [facility]  Office  [phone]  CC: Primary care physician; Patient, No Pcp Per   Note: This dictation was prepared with Dragon dictation along with smaller phrase technology. Any transcriptional  errors that result from this process are unintentional.

## 2018-09-12 NOTE — Care Management (Signed)
Patient provided application to Medication Management  [facility] .  No new medications at discharge.

## 2018-09-12 NOTE — Discharge Instructions (Signed)
Follow CK with PCP or Walk-in clinic. Smoking and drug abuse cessation.

## 2019-01-21 ENCOUNTER — Other Ambulatory Visit: Payer: Self-pay

## 2019-01-21 ENCOUNTER — Emergency Department
Admission: EM | Admit: 2019-01-21 | Discharge: 2019-01-21 | Disposition: A | Discharge status: Home / Self Care | Payer: Self-pay | Attending: Emergency Medicine | Admitting: Emergency Medicine

## 2019-01-21 DIAGNOSIS — R4689 Other symptoms and signs involving appearance and behavior: Secondary | ICD-10-CM | POA: Insufficient documentation

## 2019-01-21 DIAGNOSIS — F1910 Other psychoactive substance abuse, uncomplicated: Secondary | ICD-10-CM

## 2019-01-21 DIAGNOSIS — F17210 Nicotine dependence, cigarettes, uncomplicated: Secondary | ICD-10-CM | POA: Insufficient documentation

## 2019-01-21 LAB — CBC
HCT: 44.3 % (ref 39.0–52.0)
Hemoglobin: 15 g/dL (ref 13.0–17.0)
MCH: 29.9 pg (ref 26.0–34.0)
MCHC: 33.9 g/dL (ref 30.0–36.0)
MCV: 88.2 fL (ref 80.0–100.0)
Platelets: 261 10*3/uL (ref 150–400)
RBC: 5.02 MIL/uL (ref 4.22–5.81)
RDW: 12.1 % (ref 11.5–15.5)
WBC: 11.8 10*3/uL — ABNORMAL HIGH (ref 4.0–10.5)
nRBC: 0 % (ref 0.0–0.2)

## 2019-01-21 LAB — ETHANOL: Alcohol, Ethyl (B): <10 mg/dL (ref <10)

## 2019-01-21 LAB — COMPREHENSIVE METABOLIC PANEL
ALT: 33 U/L (ref 0–44)
AST: 52 U/L — ABNORMAL HIGH (ref 15–41)
Albumin: 5.2 g/dL — ABNORMAL HIGH (ref 3.5–5.0)
Alkaline Phosphatase: 80 U/L (ref 38–126)
Anion gap: 14 (ref 5–15)
BUN: 33 mg/dL — ABNORMAL HIGH (ref 6–20)
CO2: 25 mmol/L (ref 22–32)
Calcium: 9.9 mg/dL (ref 8.9–10.3)
Chloride: 101 mmol/L (ref 98–111)
Creatinine, Ser: 1.21 mg/dL (ref 0.61–1.24)
GFR calc Af Amer: >60 mL/min (ref >60)
GFR calc non Af Amer: >60 mL/min (ref >60)
Glucose, Bld: 111 mg/dL — ABNORMAL HIGH (ref 70–99)
Potassium: 4.7 mmol/L (ref 3.5–5.1)
Sodium: 140 mmol/L (ref 135–145)
Total Bilirubin: 1.2 mg/dL (ref 0.3–1.2)
Total Protein: 8.5 g/dL — ABNORMAL HIGH (ref 6.5–8.1)

## 2019-01-21 MED ORDER — BUPRENORPHINE HCL-NALOXONE HCL 8-2 MG SL SUBL
1.5000 | SUBLINGUAL_TABLET | Freq: Every day | SUBLINGUAL | Status: DC
  Administered 2019-01-21: 2 via SUBLINGUAL
  Filled 2019-01-21: qty 2

## 2019-01-21 MED ORDER — TETANUS-DIPHTH-ACELL PERTUSSIS 5-2.5-18.5 LF-MCG/0.5 IM SUSP
0.5000 mL | Freq: Once | INTRAMUSCULAR | Status: AC
  Administered 2019-01-21: 0.5 mL via INTRAMUSCULAR
  Filled 2019-01-21: qty 0.5

## 2019-01-21 MED ORDER — BUPRENORPHINE HCL-NALOXONE HCL 8-2 MG SL SUBL: 1.5000 | SUBLINGUAL_TABLET | Freq: Every day | SUBLINGUAL | Status: DC

## 2019-01-21 NOTE — Discharge Instructions (Signed)
You have been seen in the Emergency Department (ED)  today for a psychiatric complaint.  You have been evaluated by psychiatry and we believe you are safe to be discharged from the hospital.   ° °Please return to the Emergency Department (ED)  immediately if you have ANY thoughts of hurting yourself or anyone else, so that we may help you. ° °Please avoid alcohol and drug use. ° °Follow up with your doctor and/or therapist as soon as possible regarding today's ED  visit.  ° °You may call crisis hotline for [place] at [phone]. ° °

## 2019-01-21 NOTE — ED Provider Notes (Signed)
-----------------------------------------   9:41 AM on 01/21/2019 -----------------------------------------   Pulse 91, temperature 97.6 F (36.4 C), temperature source Oral, resp. rate 20, weight 77.1 kg, SpO2 100 %.  Patient has been evaluated by psychiatry and cleared for discharge. IVC lifted by Dr. Jola Babinski. Patient's labs have been reviewed with no acute findings. Patient will be discharged at this time to home    Don Perking, [place], MD 01/21/19 [phone]
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
## 2019-01-21 NOTE — ED Notes (Signed)
IVC PAPERS  RESCINDED  PER  DR Jola Babinski MD
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
## 2019-01-21 NOTE — ED Notes (Signed)
Pt agitated he cannot use the phone until 9am.  Yelling, cursing at staff. Maintained on 15 minute checks and observation by security camera for safety.

## 2019-01-21 NOTE — Consult Note (Signed)
[facility]   Reason for Consult: Polysubstance abuse Referring Physician:  ED Joslyn Devonatian. Patient Identification: Brian Osborn MRN:  [id number] Principal Diagnosis: <principal problem not specified> Diagnosis:  Active Problems:   * No active hospital problems. *   Total Time spent with patient: 30 minutes  Subjective:   Brian Osborn is a 28 y.o. male patient admitted with polysubstance abuse.   HPI: Patient is seen and examined.  Patient is a 28 year old male with a past psychiatric history significant for polysubstance dependence who was brought to the [facility] emergency department secondary to agitated and aggressive behaviors.  The patient stated that he had recently "got a good deal" on some medically modified benzodiazepines from [employer].  His father found the medication, and threw it out.  They got into an altercation.  The father kicked him out of the house after that.  On the morning of going to the emergency department the patient broke a window and got into the house and got into another argument with his father.  Patient stated that his father had no right to throw that medication/drug out.  He stated he is spent good money on it, and that this is a free [place] and he should be able to live the way that he wanted.  He stated that was illegal substance.  After the argument the father called the police, and the patient was aggressive and belligerent when police arrived.  He was brought to the hospital for evaluation and stabilization.  The patient stated that he had recently purchased that illicit substance off the Internet.  He was trading a car or having a car traded for that substance.  He is followed by [facility].  He stated he receives Suboxone for that.  He stated he has been trying to get his life in order, and not abusing illegal substances.  His drug screen is pending.  He denied suicidal or homicidal ideation.  He denied any  psychotic symptoms while not intoxicated.  He is not interested in substance rehabilitation.  We discussed the risk of benzodiazepine withdrawal which can be life-threatening, and he stated that he had read about this on the Internet and knows all about the substances he uses.  Past Psychiatric History: Primarily his care is been in the emergency room.  He is presented there on a couple of occasions with psychosis.  He was previously prescribed Zyprexa for this.  [facility] as [facility] and is given Suboxone.  Risk to Self:   Risk to Others:   Prior Inpatient Therapy:   Prior Outpatient Therapy:    Past Medical History: No past medical history on file. No past surgical history on file. Family History: No family history on file. Family Psychiatric  History: Noncontributory Social History:  Social History   Substance and Sexual Activity  Alcohol Use Yes   Comment: socially     Social History   Substance and Sexual Activity  Drug Use Yes  . Types: Methamphetamines   Comment: snorted today    Social History   Socioeconomic History  . Marital status: Single    Spouse name: Not on file  . Number of children: Not on file  . Years of education: Not on file  . Highest education level: Not on file  Occupational History  . Not on file  Social Needs  . Financial resource strain: Not on file  . Food insecurity:    Worry: Not  on file    Inability: Not on file  . Transportation needs:    Medical: Not on file    Non-medical: Not on file  Tobacco Use  . Smoking status: Current Every Day Smoker    Packs/day: 0.50    Types: Cigarettes  . Smokeless tobacco: Never Used  Substance and Sexual Activity  . Alcohol use: Yes    Comment: socially  . Drug use: Yes    Types: Methamphetamines    Comment: snorted today  . Sexual activity: Yes  Lifestyle  . Physical activity:    Days per week: Not on file    Minutes per session: Not on file  . Stress: Not on file   Relationships  . Social connections:    Talks on phone: Not on file    Gets together: Not on file    Attends religious service: Not on file    Active member of club or organization: Not on file    Attends meetings of clubs or organizations: Not on file    Relationship status: Not on file  Other Topics Concern  . Not on file  Social History Narrative  . Not on file   Additional Social History:    Allergies:   Allergies  Allergen Reactions  . Bee Venom Hives    Labs:  Results for orders placed or performed during the hospital encounter of 01/21/19 (from the past 48 hour(s))  CBC     Status: Abnormal   Collection Time: 01/21/19  6:32 AM  Result Value Ref Range   WBC 11.8 (H) 4.0 - 10.5 K/uL   RBC 5.02 4.22 - 5.81 MIL/uL   Hemoglobin 15.0 13.0 - 17.0 g/dL   HCT 69.5 07.2 - 25.7 %   MCV 88.2 80.0 - 100.0 fL   MCH 29.9 26.0 - 34.0 pg   MCHC 33.9 30.0 - 36.0 g/dL   RDW 50.5 18.3 - 35.8 %   Platelets 261 150 - 400 K/uL   nRBC 0.0 0.0 - 0.2 %    Comment: Performed at [facility], [address] [place], [place] [address]  Comprehensive metabolic panel     Status: Abnormal   Collection Time: 01/21/19  6:32 AM  Result Value Ref Range   Sodium 140 135 - 145 mmol/L   Potassium 4.7 3.5 - 5.1 mmol/L   Chloride 101 98 - 111 mmol/L   CO2 25 22 - 32 mmol/L   Glucose, Bld 111 (H) 70 - 99 mg/dL   BUN 33 (H) 6 - 20 mg/dL   Creatinine, Ser 8.42 0.61 - 1.24 mg/dL   Calcium 9.9 8.9 - 10.3 mg/dL   Total Protein 8.5 (H) 6.5 - 8.1 g/dL   Albumin 5.2 (H) 3.5 - 5.0 g/dL   AST 52 (H) 15 - 41 U/L   ALT 33 0 - 44 U/L   Alkaline Phosphatase 80 38 - 126 U/L   Total Bilirubin 1.2 0.3 - 1.2 mg/dL   GFR calc non Af Amer >60 >60 mL/min   GFR calc Af Amer >60 >60 mL/min   Anion gap 14 5 - 15    Comment: Performed at [facility], [address] [place], [place] [address]  Ethanol     Status: None   Collection Time: 01/21/19  6:32 AM  Result Value Ref Range   Alcohol,  Ethyl (B) <10 <10 mg/dL    Comment: (NOTE) Lowest detectable limit for serum alcohol is 10 mg/dL. For medical purposes only. Performed  at [facility], [address] [place], [place] [address]     Current Facility-Administered Medications  Medication Dose Route Frequency Provider Last Rate Last Dose  . buprenorphine-naloxone (SUBOXONE) 8-2 mg per SL tablet 2 tablet  2 tablet Sublingual Daily Don Perking, [place], MD   2 tablet at 01/21/19 0745   Current Outpatient Medications  Medication Sig Dispense Refill  . buprenorphine-naloxone (SUBOXONE) 8-2 mg SUBL SL tablet Place 1.5 tablets under the tongue daily.      Musculoskeletal: Strength & Muscle Tone: within normal limits Gait & Station: Limping Patient leans: N/A  Psychiatric Specialty Exam: Physical Exam  Nursing note and vitals reviewed. Constitutional: He is oriented to person, place, and time. He appears well-developed and well-nourished.  HENT:  Head: Normocephalic and atraumatic.  Respiratory: Effort normal.  Neurological: He is alert and oriented to person, place, and time.    ROS  Pulse 91, temperature 97.6 F (36.4 C), temperature source Oral, resp. rate 20, weight 77.1 kg, SpO2 100 %.Body mass index is 21.83 kg/m.  General Appearance: Disheveled  Eye Contact:  Good  Speech:  Normal Rate  Volume:  Increased  Mood:  Dysphoric and Irritable  Affect:  Congruent  Thought Process:  Coherent and Descriptions of Associations: Circumstantial  Orientation:  Full (Time, Place, and Person)  Thought Content:  Rumination  Suicidal Thoughts:  No  Homicidal Thoughts:  No  Memory:  Immediate;   Fair Recent;   Fair Remote;   Fair  Judgement:  Impaired  Insight:  Lacking  Psychomotor Activity:  Increased  Concentration:  Concentration: Fair and Attention Span: Fair  Recall:  [employer] of Knowledge:  Fair  Language:  Fair  Akathisia:  Negative  Handed:  Right  AIMS (if indicated):     Assets:  Desire for  Improvement Resilience  ADL's:  Intact  Cognition:  WNL  Sleep:        Treatment Plan Summary: Daily contact with patient to assess and evaluate symptoms and progress in treatment, Medication management and Plan : Patient is seen and examined.  Patient is a 28 year old male with a past psychiatric history significant for polysubstance dependence/abuse.  His substances include opiates and benzodiazepines.  Most recently he obtained a chemically mollified alprazolam molecule which would be considered "legal".  He got into a fight with his father about having this substance.  He is not interested in substance treatment, he is not interested in psychiatric treatment, he is not suicidal or homicidal..  He is not psychotic.  He does not fulfill criteria for involuntary commitment, and will be released to follow-up with [facility] psychiatric.  I have discussed with him the risk of benzodiazepine withdrawal including a life-threatening withdrawal, and he seems to appreciate this and the fact that he stated "I have read all about it."  Disposition: Patient does not meet criteria for psychiatric inpatient admission.  Antonieta Pert, MD 01/21/2019 9:24 AM

## 2019-01-21 NOTE — ED Notes (Signed)
Pt discharged to lobby. VS stable. Pt denies SI/HI. Pt has pain in heels from jumping off a roof. All belongings returned to patient. Pt refused to sign for discharge paperwork. Paperwork given to patient.

## 2019-01-21 NOTE — ED Triage Notes (Signed)
Pt brought in under IVC. 

## 2019-01-21 NOTE — ED Notes (Signed)
Pt dressing for discharge. Pt is upset he does not have any shoes in his belongings. VS stable. Maintained on 15 minute checks and observation by security camera for safety.

## 2019-02-19 ENCOUNTER — Encounter: Payer: Self-pay | Admitting: Emergency Medicine

## 2019-02-19 ENCOUNTER — Emergency Department
Admission: EM | Admit: 2019-02-19 | Discharge: 2019-02-20 | Disposition: A | Discharge status: Other Acute Inpatient Hospital | Payer: Self-pay | Attending: Emergency Medicine | Admitting: Emergency Medicine

## 2019-02-19 ENCOUNTER — Other Ambulatory Visit: Payer: Self-pay

## 2019-02-19 DIAGNOSIS — R45851 Suicidal ideations: Secondary | ICD-10-CM | POA: Insufficient documentation

## 2019-02-19 DIAGNOSIS — F1910 Other psychoactive substance abuse, uncomplicated: Secondary | ICD-10-CM

## 2019-02-19 DIAGNOSIS — R4182 Altered mental status, unspecified: Secondary | ICD-10-CM | POA: Insufficient documentation

## 2019-02-19 DIAGNOSIS — F1920 Other psychoactive substance dependence, uncomplicated: Secondary | ICD-10-CM | POA: Insufficient documentation

## 2019-02-19 DIAGNOSIS — F17210 Nicotine dependence, cigarettes, uncomplicated: Secondary | ICD-10-CM | POA: Insufficient documentation

## 2019-02-19 HISTORY — DX: Other psychoactive substance abuse, in remission: F19.11

## 2019-02-19 LAB — COMPREHENSIVE METABOLIC PANEL
ALT: 14 U/L (ref 0–44)
AST: 31 U/L (ref 15–41)
Albumin: 4 g/dL (ref 3.5–5.0)
Alkaline Phosphatase: 86 U/L (ref 38–126)
Anion gap: 8 (ref 5–15)
BUN: 16 mg/dL (ref 6–20)
CO2: 27 mmol/L (ref 22–32)
Calcium: 8.7 mg/dL — ABNORMAL LOW (ref 8.9–10.3)
Chloride: 106 mmol/L (ref 98–111)
Creatinine, Ser: 1.04 mg/dL (ref 0.61–1.24)
GFR calc Af Amer: >60 mL/min (ref >60)
GFR calc non Af Amer: >60 mL/min (ref >60)
Glucose, Bld: 119 mg/dL — ABNORMAL HIGH (ref 70–99)
Potassium: 4.3 mmol/L (ref 3.5–5.1)
Sodium: 141 mmol/L (ref 135–145)
Total Bilirubin: 0.2 mg/dL — ABNORMAL LOW (ref 0.3–1.2)
Total Protein: 6.3 g/dL — ABNORMAL LOW (ref 6.5–8.1)

## 2019-02-19 LAB — CBC WITH DIFFERENTIAL/PLATELET
Abs Immature Granulocytes: 0.01 10*3/uL (ref 0.00–0.07)
Basophils Absolute: 0 10*3/uL (ref 0.0–0.1)
Basophils Relative: 1 %
Eosinophils Absolute: 0.3 10*3/uL (ref 0.0–0.5)
Eosinophils Relative: 5 %
HCT: 36.5 % — ABNORMAL LOW (ref 39.0–52.0)
Hemoglobin: 12.7 g/dL — ABNORMAL LOW (ref 13.0–17.0)
Immature Granulocytes: 0 %
Lymphocytes Relative: 31 %
Lymphs Abs: 1.9 10*3/uL (ref 0.7–4.0)
MCH: 30.4 pg (ref 26.0–34.0)
MCHC: 34.8 g/dL (ref 30.0–36.0)
MCV: 87.3 fL (ref 80.0–100.0)
Monocytes Absolute: 0.5 10*3/uL (ref 0.1–1.0)
Monocytes Relative: 9 %
Neutro Abs: 3.4 10*3/uL (ref 1.7–7.7)
Neutrophils Relative %: 54 %
Platelets: 247 10*3/uL (ref 150–400)
RBC: 4.18 MIL/uL — ABNORMAL LOW (ref 4.22–5.81)
RDW: 12 % (ref 11.5–15.5)
WBC: 6.2 10*3/uL (ref 4.0–10.5)
nRBC: 0 % (ref 0.0–0.2)

## 2019-02-19 LAB — URINE DRUG SCREEN, QUALITATIVE (ARMC ONLY)
Amphetamines, Ur Screen: NOT DETECTED
Barbiturates, Ur Screen: NOT DETECTED
Benzodiazepine, Ur Scrn: POSITIVE — AB
Cannabinoid 50 Ng, Ur ~~LOC~~: POSITIVE — AB
Cocaine Metabolite,Ur ~~LOC~~: NOT DETECTED
MDMA (Ecstasy)Ur Screen: POSITIVE — AB
Methadone Scn, Ur: NOT DETECTED
Opiate, Ur Screen: NOT DETECTED
Phencyclidine (PCP) Ur S: NOT DETECTED
Tricyclic, Ur Screen: NOT DETECTED

## 2019-02-19 LAB — SALICYLATE LEVEL: Salicylate Lvl: <7 mg/dL (ref 2.8–30.0)

## 2019-02-19 LAB — ETHANOL: Alcohol, Ethyl (B): <10 mg/dL (ref <10)

## 2019-02-19 LAB — ACETAMINOPHEN LEVEL: Acetaminophen (Tylenol), Serum: <10 ug/mL — ABNORMAL LOW (ref 10–30)

## 2019-02-19 LAB — TROPONIN I: Troponin I: <0.03 ng/mL (ref <0.03)

## 2019-02-19 MED ORDER — HALOPERIDOL LACTATE 5 MG/ML IJ SOLN
10.0000 mg | Freq: Once | INTRAMUSCULAR | Status: AC
  Administered 2019-02-19: 10 mg via INTRAMUSCULAR
  Filled 2019-02-19: qty 2

## 2019-02-19 MED ORDER — LORAZEPAM 2 MG/ML IJ SOLN
2.0000 mg | Freq: Once | INTRAMUSCULAR | Status: AC
  Administered 2019-02-19: 2 mg via INTRAMUSCULAR
  Filled 2019-02-19: qty 1

## 2019-02-19 NOTE — BH Assessment (Addendum)
Assessment Note  Brian Osborn is an 28 y.o. male who presented to the ED with law enforcement under IVC from [facility] due to reports of him disclosing suicidal ideation to staff at a restaurant.   Upon assessment, patient was calm and responsive - cooperative with IM injections from his nurse.  As time progressed, the patient became more irritable and agitated and started yelling.  He said "y'all can't keep me here against my will" and "I will find a way out of here - I will go up through the ceiling and get out."   Brian Osborn denied SI/HI or AVH, though, he is clear that he can visually see "society is going to shit." He was apparently speaking nonsensically to the ED triage nurse. Patient stated he is here at [facility] because "I got picked by these assholes who called the police on me becaue I cut my hands and they took me to [facility]."  While there, they said they were putting him [under commitment] and "made me see the doctor on the camera so they could get me to say stuff to make them send me here." When asked if he's thinking about suicide or homicide, he denied and stated "My body hurts, and that's what I'm thinking about."  When asked about violent behavior, he states "If I get in a fifth with a friend, I don't even want to hit them back."  On observation, there did not appear to be any new lacerations.  IVC states there were cuts from him digging in the trash can to get drugs. Patient reports a long history of drug use.  He is currently on Suboxone maintenance and is loudly requesting buprenorphine. Opioid use initiated in 2006 and continued until 2008.  He began using buprenorphine illicitly at that time and has been on prescribed maintenance medication for several years. He uses benzodiazepines illicitly though he states he needs them legitimately.  He stated "Xanax is too sedating.  I like clonazepam, it decreases the mania and the snappiness.  It gives me a good balance." Patient reports IV cocaine use at ages [age].   Last use of cannabis was 3-4 years ago. Last use of alcohol was "I don't like it - I don't know." Recent substance use (in the past 72 hours) = 10mg  Klonopin, Suboxone, and possibly other non-medical use of prescription medications. He reports sleeping well, 8-9 hours per night.  His appetite was good - he was devouring his his meal during the assessment - even while he was receiving IM medications.  Patient states he lives with his "demonic father." His support person is his mother Brian Osborn who's number he is unable to provide.  He is currently unemployed and looking for work.    Diagnosis: Altered Mental Status - r/o substance induced psychosis  Past Medical History:  Past Medical History:  Diagnosis Date  . History of drug abuse (HCC)     History reviewed. No pertinent surgical history.  Family History: History reviewed. No pertinent family history.  Social History:  reports that he has been smoking cigarettes. He has been smoking about 0.50 packs per day. He has never used smokeless tobacco. He reports current alcohol use. He reports current drug use. Drug: Methamphetamines.  Additional Social History:  Alcohol / Drug Use Pain Medications: SEE PTA  Prescriptions: SEE PTA  Over the Counter: SEE PTA  History of alcohol / drug use?: Yes Longest period of sobriety (when/how long): Unable to quantify Negative Consequences of Use: Personal  relationships, Financial Withdrawal Symptoms: Agitation, Irritability Substance #1 Name of Substance 1: Opioids 1 - Age of First Use: 14 1 - Amount (size/oz): roxi 30s 1 - Frequency: daily use of opioids 1 - Duration: years 1 - Last Use / Amount: suboxone in the last 24 hours Substance #2 Name of Substance 2: Benzodiazepines 2 - Age of First Use: 17 2 - Amount (size/oz): various 2 - Frequency: a few days a week 2 - Duration: years 2 - Last Use / Amount: a few days ago Substance #3 Name of Substance 3: cocaine 3 - Age of First Use: 25 3 -  Amount (size/oz): 1/2 a gram by injection 3 - Frequency: 2-3 days per week` 3 - Duration: 2-3x per week for 4-5 months 3 - Last Use / Amount: age 73 Substance #4 Name of Substance 4: alcohol 4 - Last Use / Amount: unable to quantify Substance #5 Name of Substance 5: cannabis 5 - Last Use / Amount: 3-4 years ago  CIWA: CIWA-Ar BP: (!) 118/50 Pulse Rate: 86 COWS:    Allergies:  Allergies  Allergen Reactions  . Bee Venom Hives    Home Medications: (Not in a hospital admission)   OB/GYN Status:  No LMP for male patient.  General Assessment Data Location of Assessment: [facility] ED TTS Assessment: In system Is this a Tele or Face-to-Face Assessment?: Face-to-Face Is this an Initial Assessment or a Re-assessment for this encounter?: Initial Assessment Patient Accompanied by:: N/A Language Other than English: No Living Arrangements: Other (Comment) What gender do you identify as?: Male Marital status: (unknown) Pregnancy Status: No Living Arrangements: Parent Can pt return to current living arrangement?: Yes Admission Status: Involuntary Is patient capable of signing voluntary admission?: No Referral Source: Other([facility]) Insurance type: no Engineer, civil (consulting) Exam [facility] Walk-in ONLY) Medical Exam completed: Yes Reason for MSE not completed: Other:(in process)  Crisis Care Plan Living Arrangements: Parent  Education Status Is patient currently in school?: No Is the patient employed, unemployed or receiving disability?: Unemployed  Risk to self with the past 6 months Suicidal Ideation: No Has patient been a risk to self within the past 6 months prior to admission? : No Suicidal Intent: No Has patient had any suicidal intent within the past 6 months prior to admission? : No Is patient at risk for suicide?: No Suicidal Plan?: No Has patient had any suicidal plan within the past 6 months prior to admission? : No Access to Means: No What has been your use of  drugs/alcohol within the last 12 months?: near daily use Previous Attempts/Gestures: No(denies) Other Self Harm Risks: ongoing drug use Triggers for Past Attempts: None known Intentional Self Injurious Behavior: None(patient denies) Family Suicide History: No Recent stressful life event(s): (none noted) Persecutory voices/beliefs?: No Depression: Yes Depression Symptoms: Tearfulness, Feeling angry/irritable Substance abuse history and/or treatment for substance abuse?: Yes Suicide prevention information given to non-admitted patients: Not applicable  Risk to Others within the past 6 months Homicidal Ideation: No Does patient have any lifetime risk of violence toward others beyond the six months prior to admission? : No Thoughts of Harm to Others: No Current Homicidal Intent: No Current Homicidal Plan: No Access to Homicidal Means: No Identified Victim: none History of harm to others?: No Assessment of Violence: None Noted Violent Behavior Description: none noted Does patient have access to weapons?: No Criminal Charges Pending?: No Does patient have a court date: No Is patient on probation?: No  Psychosis Hallucinations: None noted Delusions: Unspecified  Mental Status Report Appearance/Hygiene: Disheveled, Poor hygiene, In scrubs Eye Contact: Poor Motor Activity: Freedom of movement, Agitation Speech: Logical/coherent, Pressured, Loud Level of Consciousness: Alert, Irritable Mood: Labile, Suspicious, Angry, Other (Comment)(Paranoid) Affect: Angry, Apprehensive, Irritable, Preoccupied(Paranoid) Anxiety Level: Moderate Thought Processes: Coherent, Relevant, Circumstantial Judgement: Impaired Orientation: Person, Place, Time, Situation, Appropriate for developmental age Obsessive Compulsive Thoughts/Behaviors: None  Cognitive Functioning Concentration: Fair Memory: Recent Intact, Remote Intact Is patient IDD: No Insight: see judgement above Impulse Control:  Fair Appetite: Good Have you had any weight changes? : No Change Sleep: No Change Total Hours of Sleep: 9 Vegetative Symptoms: None  ADLScreening [facility]) Patient's cognitive ability adequate to safely complete daily activities?: Yes Patient able to express need for assistance with ADLs?: Yes Independently performs ADLs?: Yes (appropriate for developmental age)  Prior Inpatient Therapy Prior Inpatient Therapy: No  Prior Outpatient Therapy Prior Outpatient Therapy: Yes Prior Therapy Dates: current Prior Therapy Facilty/Provider(s): [facility] Reason for Treatment: Opioid Treatment Program Does patient have an ACCT team?: No Does patient have Intensive In-House Services?  : No Does patient have [facility] services? : No Does patient have P4CC services?: No  ADL Screening (condition at time of admission) Patient's cognitive ability adequate to safely complete daily activities?: Yes Is the patient deaf or have difficulty hearing?: No Does the patient have difficulty concentrating, remembering, or making decisions?: No Patient able to express need for assistance with ADLs?: Yes Does the patient have difficulty dressing or bathing?: No Independently performs ADLs?: Yes (appropriate for developmental age) Does the patient have difficulty walking or climbing stairs?: No Weakness of Legs: None Weakness of Arms/Hands: None  Home Assistive Devices/Equipment Home Assistive Devices/Equipment: None  Therapy Consults (therapy consults require a physician order) PT Evaluation Needed: No OT Evalulation Needed: No SLP Evaluation Needed: No Abuse/Neglect Assessment (Assessment to be complete while patient is alone) Abuse/Neglect Assessment Can Be Completed: Yes Physical Abuse: Denies Verbal Abuse: Denies Sexual Abuse: Denies Exploitation of patient/patient's resources: Denies Self-Neglect: Denies Values / Beliefs Cultural Requests During Hospitalization:  None Spiritual Requests During Hospitalization: None Consults Spiritual Care Consult Needed: No Social Work Consult Needed: No Merchant navy officer (For Healthcare) Does Patient Have a Medical Advance Directive?: No          Disposition:  Disposition Initial Assessment Completed for this Encounter: Yes Patient referred to: [facility] or other as appropriate)  On Site Evaluation by:  Pending psychiatric evaluation  Demetrios Isaacs [facility] - Inpatient 02/19/2019 6:07 PM

## 2019-02-19 NOTE — ED Triage Notes (Addendum)
"  the truth is our founding fathers built this country to smoke cannabis, there is no justice in this. We lost everything, my voice is loud, what is your voice".  Pt having loud pressure speech. Sent IVC from [facility] with [employer].  Pt talking about cuts on his hands and that his dad is just spiteful.  Keeps yelling that he is not suicidal.  "my dad makes me feel like I don't exist".  Pt remains to yell about the country and our founding fathers and how is family is manipulating him.  Asking for food.

## 2019-02-19 NOTE — ED Notes (Signed)
Patient tolerated IM injections without difficulty, no behaviorals noted.

## 2019-02-19 NOTE — ED Provider Notes (Signed)
[facility] Emergency Department Provider Note       Time seen: ----------------------------------------- 4:31 PM on 02/19/2019 ----------------------------------------- Level V caveat: History/ROS limited by psychosis  I have reviewed the triage vital signs and the nursing notes.  HISTORY   Chief Complaint Psychiatric Evaluation    HPI Wende MottKory L Piacentini is a 28 y.o. male with a history of polysubstance abuse who presents to the ED for involuntary commitment.  Patient was [facility] for erratic behavior and voicing suicidal ideation.  He denies these complaints.  Patient arrives with manic type behavior.  No past medical history on file.  Patient Active Problem List   Diagnosis Date Noted  . 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 Substance induced mood disorder (HCC) 01/13/2018  . Benzodiazepine abuse (HCC) 01/13/2018    No past surgical history on file.  Allergies Bee venom  Social History Social History   Tobacco Use  . Smoking status: Current Every Day Smoker    Packs/day: 0.50    Types: Cigarettes  . Smokeless tobacco: Never Used  Substance Use Topics  . Alcohol use: Yes    Comment: socially  . Drug use: Yes    Types: Methamphetamines    Comment: snorted today   Review of Systems Unknown at this time, patient psychotic  All systems negative/normal/unremarkable except as stated in the HPI  ____________________________________________   PHYSICAL EXAM:  VITAL SIGNS: ED Triage Vitals  Enc Vitals Group     BP      Pulse      Resp      Temp      Temp src      SpO2      Weight      Height      Head Circumference      Peak Flow      Pain Score      Pain Loc      Pain Edu?      Excl. in GC?    Constitutional: Alert but agitated with pressured speech.  Mild distress Eyes: Conjunctivae are normal.  Normal extraocular movements. ENT      Head: Normocephalic and atraumatic.      Nose: No congestion/rhinnorhea.      Mouth/Throat: Mucous membranes are moist.      Neck: No stridor.  Cardiovascular: Normal rate, regular rhythm. No murmurs, rubs, or gallops. Respiratory: Normal respiratory effort without tachypnea nor retractions. Breath sounds are clear and equal bilaterally. No wheezes/rales/rhonchi. Gastrointestinal: Soft and nontender. Normal bowel sounds Musculoskeletal: Nontender with normal range of motion in extremities. No lower extremity tenderness nor edema. Neurologic:  Normal speech and language. No gross focal neurologic deficits are appreciated.  Skin:  Skin is warm, dry and intact. No rash noted. Psychiatric: Elevated mood and behavior ____________________________________________  ED COURSE:  As part of my medical decision making, I reviewed the following data within the electronic MEDICAL RECORD NUMBER History obtained from family if available, nursing notes, old chart and ekg, as well as notes from prior ED visits. Patient presented for acute psychosis, we will assess with labs as indicated at this time.   Procedures  Wende MottKory L Riner was evaluated in Emergency Department on 02/19/2019 for the symptoms described in the history of present illness. He was evaluated in the context of the global COVID-19 pandemic, which necessitated consideration that the patient might be at risk for infection with  the SARS-CoV-2 virus that causes COVID-19. Institutional protocols and algorithms that pertain to the evaluation of patients at risk for COVID-19 are in a state of rapid change based on information released by regulatory bodies including the CDC and federal and state organizations. These policies and algorithms were followed during the patient's care in the ED.  ____________________________________________   LABS (pertinent positives/negatives)  Labs Reviewed  CBC WITH DIFFERENTIAL/PLATELET  - Abnormal; Notable for the following components:      Result Value   RBC 4.18 (*)    Hemoglobin 12.7 (*)    HCT 36.5 (*)    All other components within normal limits  COMPREHENSIVE METABOLIC PANEL - Abnormal; Notable for the following components:   Glucose, Bld 119 (*)    Calcium 8.7 (*)    Total Protein 6.3 (*)    Total Bilirubin 0.2 (*)    All other components within normal limits  URINE DRUG SCREEN, QUALITATIVE ([facility] ONLY) - Abnormal; Notable for the following components:   MDMA (Ecstasy)Ur Screen POSITIVE (*)    Cannabinoid 50 Ng, Ur [place] POSITIVE (*)    Benzodiazepine, Ur Scrn POSITIVE (*)    All other components within normal limits  TROPONIN I  ETHANOL  ACETAMINOPHEN LEVEL  SALICYLATE LEVEL  ____________________________________________   DIFFERENTIAL DIAGNOSIS   Acute psychosis, polysubstance abuse, schizophrenia, medication noncompliance  FINAL ASSESSMENT AND PLAN  Acute psychosis, polysubstance abuse   Plan: The patient had presented for involuntary commitment.  Patient was acutely agitated requiring Haldol and Ativan for sedation.  Patient's labs grossly unremarkable, he appears medically clear for psychiatric evaluation and disposition.  He remains under involuntary commitment.   Ulice Dash, MD    Note: This note was generated in part or whole with voice recognition software. Voice recognition is usually quite accurate but there are transcription errors that can and very often do occur. I apologize for any typographical errors that were not detected and corrected.     Emily Filbert, MD 02/19/19 2029

## 2019-02-19 NOTE — ED Notes (Signed)
Patient ate 100% of supper tray and beverage.  

## 2019-02-19 NOTE — ED Notes (Signed)
Patient yells at times, but can be redirected, he told nurse that " no one really cares about me' and with this covid stuff it makes me feel worse, Patient states that he will let nurse give IM injections, Patient denies Si/hi or avh at this time, will continue to monitor, Patient is IVC'd. Nurse listens to Patient and shows empathy.

## 2019-02-20 ENCOUNTER — Inpatient Hospital Stay (HOSPITAL_COMMUNITY)
Admission: AD | Admit: 2019-02-20 | Discharge: 2019-02-23 | DRG: 885 | Disposition: A | Discharge status: Home / Self Care | Payer: No Typology Code available for payment source | Source: Intra-hospital | Attending: Psychiatry | Admitting: Psychiatry

## 2019-02-20 ENCOUNTER — Encounter (HOSPITAL_COMMUNITY): Payer: Self-pay

## 2019-02-20 ENCOUNTER — Other Ambulatory Visit: Payer: Self-pay

## 2019-02-20 DIAGNOSIS — F1310 Sedative, hypnotic or anxiolytic abuse, uncomplicated: Secondary | ICD-10-CM | POA: Diagnosis present

## 2019-02-20 DIAGNOSIS — F17210 Nicotine dependence, cigarettes, uncomplicated: Secondary | ICD-10-CM | POA: Diagnosis present

## 2019-02-20 DIAGNOSIS — G4700 Insomnia, unspecified: Secondary | ICD-10-CM | POA: Diagnosis present

## 2019-02-20 DIAGNOSIS — Z7289 Other problems related to lifestyle: Secondary | ICD-10-CM

## 2019-02-20 DIAGNOSIS — F319 Bipolar disorder, unspecified: Principal | ICD-10-CM | POA: Diagnosis present

## 2019-02-20 MED ORDER — ADULT MULTIVITAMIN W/MINERALS CH
1.0000 | ORAL_TABLET | Freq: Every day | ORAL | Status: DC
  Administered 2019-02-21: 1 via ORAL
  Filled 2019-02-20 (×3): qty 1

## 2019-02-20 MED ORDER — LORAZEPAM 1 MG PO TABS
0.0000 mg | ORAL_TABLET | Freq: Four times a day (QID) | ORAL | Status: DC
  Administered 2019-02-21: 1 mg via ORAL
  Filled 2019-02-20: qty 1

## 2019-02-20 MED ORDER — LORAZEPAM 1 MG PO TABS: 0.0000 mg | ORAL_TABLET | Freq: Two times a day (BID) | ORAL | Status: DC

## 2019-02-20 MED ORDER — ADULT MULTIVITAMIN W/MINERALS CH
1.0000 | ORAL_TABLET | Freq: Every day | ORAL | Status: DC
  Administered 2019-02-20: 1 via ORAL
  Filled 2019-02-20: qty 1

## 2019-02-20 MED ORDER — THIAMINE HCL 100 MG/ML IJ SOLN: 100.0000 mg | Freq: Every day | INTRAMUSCULAR | Status: DC

## 2019-02-20 MED ORDER — LORAZEPAM 2 MG/ML IJ SOLN: 1.0000 mg | Freq: Four times a day (QID) | INTRAMUSCULAR | Status: DC | PRN

## 2019-02-20 MED ORDER — VITAMIN B-1 100 MG PO TABS
100.0000 mg | ORAL_TABLET | Freq: Every day | ORAL | Status: DC
  Administered 2019-02-20: 100 mg via ORAL
  Filled 2019-02-20: qty 1

## 2019-02-20 MED ORDER — LORAZEPAM 2 MG PO TABS: 0.0000 mg | ORAL_TABLET | Freq: Two times a day (BID) | ORAL | Status: DC

## 2019-02-20 MED ORDER — FOLIC ACID 1 MG PO TABS
1.0000 mg | ORAL_TABLET | Freq: Every day | ORAL | Status: DC
  Administered 2019-02-21 – 2019-02-23 (×3): 1 mg via ORAL
  Filled 2019-02-20 (×4): qty 1

## 2019-02-20 MED ORDER — MAGNESIUM HYDROXIDE 400 MG/5ML PO SUSP: 30.0000 mL | Freq: Every day | ORAL | Status: DC | PRN

## 2019-02-20 MED ORDER — NICOTINE POLACRILEX 2 MG MT GUM
2.0000 mg | CHEWING_GUM | OROMUCOSAL | Status: DC | PRN
  Administered 2019-02-21 – 2019-02-23 (×8): 2 mg via ORAL
  Filled 2019-02-20 (×3): qty 1

## 2019-02-20 MED ORDER — HALOPERIDOL LACTATE 5 MG/ML IJ SOLN: 5.0000 mg | Freq: Once | INTRAMUSCULAR | Status: DC

## 2019-02-20 MED ORDER — DIPHENHYDRAMINE HCL 50 MG/ML IJ SOLN: 50.0000 mg | Freq: Four times a day (QID) | INTRAMUSCULAR | Status: DC | PRN

## 2019-02-20 MED ORDER — LORAZEPAM 1 MG PO TABS
1.0000 mg | ORAL_TABLET | Freq: Four times a day (QID) | ORAL | Status: DC | PRN
  Administered 2019-02-20: 1 mg via ORAL
  Filled 2019-02-20: qty 1

## 2019-02-20 MED ORDER — LORAZEPAM 1 MG PO TABS: 1.0000 mg | ORAL_TABLET | Freq: Four times a day (QID) | ORAL | Status: DC | PRN

## 2019-02-20 MED ORDER — VITAMIN B-1 100 MG PO TABS
100.0000 mg | ORAL_TABLET | Freq: Every day | ORAL | Status: DC
  Administered 2019-02-21: 100 mg via ORAL
  Filled 2019-02-20 (×2): qty 1

## 2019-02-20 MED ORDER — DIPHENHYDRAMINE HCL 25 MG PO CAPS
50.0000 mg | ORAL_CAPSULE | Freq: Four times a day (QID) | ORAL | Status: DC | PRN
  Filled 2019-02-20: qty 2

## 2019-02-20 MED ORDER — GABAPENTIN 300 MG PO CAPS
300.0000 mg | ORAL_CAPSULE | Freq: Three times a day (TID) | ORAL | Status: DC
  Administered 2019-02-21 – 2019-02-23 (×7): 300 mg via ORAL
  Filled 2019-02-20 (×12): qty 1

## 2019-02-20 MED ORDER — GABAPENTIN 300 MG PO CAPS
300.0000 mg | ORAL_CAPSULE | Freq: Three times a day (TID) | ORAL | Status: DC
  Administered 2019-02-20: 12:00:00 300 mg via ORAL
  Filled 2019-02-20: qty 1

## 2019-02-20 MED ORDER — ALUM & MAG HYDROXIDE-SIMETH 200-200-20 MG/5ML PO SUSP: 30.0000 mL | ORAL | Status: DC | PRN

## 2019-02-20 MED ORDER — ACETAMINOPHEN 325 MG PO TABS
650.0000 mg | ORAL_TABLET | Freq: Four times a day (QID) | ORAL | Status: DC | PRN
  Administered 2019-02-22: 650 mg via ORAL
  Filled 2019-02-20: qty 2

## 2019-02-20 MED ORDER — FOLIC ACID 1 MG PO TABS
1.0000 mg | ORAL_TABLET | Freq: Every day | ORAL | Status: DC
  Administered 2019-02-20: 1 mg via ORAL
  Filled 2019-02-20: qty 1

## 2019-02-20 MED ORDER — LORAZEPAM 2 MG PO TABS: 0.0000 mg | ORAL_TABLET | Freq: Four times a day (QID) | ORAL | Status: DC

## 2019-02-20 MED ORDER — DIPHENHYDRAMINE HCL 25 MG PO CAPS
50.0000 mg | ORAL_CAPSULE | Freq: Four times a day (QID) | ORAL | Status: DC | PRN
  Administered 2019-02-20: 50 mg via ORAL
  Filled 2019-02-20: qty 2
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
## 2019-02-20 NOTE — ED Notes (Signed)
EMTALA reviewed. 

## 2019-02-20 NOTE — BH Assessment (Cosign Needed)
Brian Osborn is a 28 y.o. male with a history of polysubstance abuse who presents to [facility] ED under involuntary commitment status (IVC). Provider made several attempts to assess the patient. But was unsuccessful. The patient is currently asleep and is unable to be aroused.  Provider will make another attempt later this shift.

## 2019-02-20 NOTE — Progress Notes (Signed)
D: Pt kept to himself this evening, pt stayed in room a lot A: Pt was offered support and encouragement.  Pt was encourage to attend groups. Q 15 minute checks were done for safety.  R: safety maintained on unit.

## 2019-02-20 NOTE — ED Notes (Signed)
Hourly rounding reveals patient sleeping in room. No complaints, stable, in no acute distress. Q15 minute rounds and monitoring via Security to continue. 

## 2019-02-20 NOTE — BH Assessment (Signed)
Patient has been accepted to [facility].  Patient assigned to room 300-2 Accepting physician is Dr. Adela Glimpse.  Call report to [phone].  Representative was Nira Retort   ER Staff is aware of it:  Bonita Quin, ER Secretary  Dr. Marisa Severin, ER MD  Amy B., Patient's Nurse

## 2019-02-20 NOTE — ED Notes (Signed)
Patient standing in door threathing to leave very upset stating what kind of meds he wants and now because he has not them in two days

## 2019-02-20 NOTE — Tx Team (Signed)
Initial Treatment Plan 02/20/2019 6:53 PM [name] GBM:[id number]    PATIENT STRESSORS: Health problems Marital or family conflict Substance abuse   PATIENT STRENGTHS: Ability for insight Average or above average intelligence Capable of independent living Communication skills General fund of knowledge Motivation for treatment/growth Supportive family/friends   PATIENT IDENTIFIED PROBLEMS: Substance abuse  Medication change                   DISCHARGE CRITERIA:  Ability to meet basic life and health needs Improved stabilization in mood, thinking, and/or behavior Medical problems require only outpatient monitoring Motivation to continue treatment in a less acute level of care  PRELIMINARY DISCHARGE PLAN: Attend aftercare/continuing care group Attend PHP/IOP Outpatient therapy Participate in family therapy  PATIENT/FAMILY INVOLVEMENT: This treatment plan has been presented to and reviewed with the patient, Brian Osborn.  The patient and family have been given the opportunity to ask questions and make suggestions.  Raylene Miyamoto, RN 02/20/2019, 6:53 PM

## 2019-02-20 NOTE — ED Provider Notes (Signed)
-----------------------------------------   6:57 AM on 02/20/2019 -----------------------------------------   Blood pressure 114/69, pulse (!) 54, temperature 98 F (36.7 C), temperature source Oral, resp. rate 18, height 6\' 3"  (1.905 m), weight 76.2 kg, SpO2 100 %.  The patient is sleeping at this time.  There have been no acute events since the last update.  Awaiting disposition plan from Behavioral Medicine team.   Irean Hong, MD 02/20/19 [phone]

## 2019-02-20 NOTE — ED Notes (Signed)
Pt is in doorway yelling about not receiving medications he needs. RN will notify NP.

## 2019-02-20 NOTE — Progress Notes (Signed)
Admission note  Pt is a 28 yo male that presents IVC'd by his father on 02/20/2019 with worsening substance abuse, anxiety, depression, and worrying. Pt states that his father is out to get him. Pt states that his father thought he was shooting up his suboxone, which he was fixated on during the assessment. Pt states he has chronic back pain and this helps. Pt has complaints of restlessness during the pandemic, and is always thinking about his suboxone. Pt did admit to using MDMA 5 days ago. Pt states he is unemployed but looking for work. Pt states he lives with his father and girlfriend. Pt denies si/hi/ah/vh and verbally agrees to approach staff if these become apparent or before harming himself/others while at [facility]. Pt is visibly anxious. Pt denies past/present verbal/physical/sexual abuse. Pt states he smokes 1ppd. Pt denies any Rx abuse/use. Pt denies alcohol abuse. Pt does have an allergy to bee venom.   Consents signed, skin/belongings search completed and patient oriented to unit. Patient stable at this time. Patient given the opportunity to express concerns and ask questions. Patient given toiletries. Will continue to monitor.
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
## 2019-02-20 NOTE — ED Notes (Signed)
Pt discharged under IVC to [facility] under IVC.  VS stable. Report called to [place], Charity fundraiser. All belongings given to officers. Pt accepting if disposition. Pt denies SI at this time.

## 2019-02-20 NOTE — ED Notes (Signed)
Patient talking to [facility] TTS

## 2019-02-21 DIAGNOSIS — F319 Bipolar disorder, unspecified: Secondary | ICD-10-CM | POA: Diagnosis not present

## 2019-02-21 MED ORDER — ONDANSETRON 4 MG PO TBDP: 4.0000 mg | ORAL_TABLET | Freq: Four times a day (QID) | ORAL | Status: DC | PRN

## 2019-02-21 MED ORDER — HYDROXYZINE HCL 25 MG PO TABS: 25.0000 mg | ORAL_TABLET | Freq: Four times a day (QID) | ORAL | Status: DC | PRN

## 2019-02-21 MED ORDER — LORAZEPAM 1 MG PO TABS
1.0000 mg | ORAL_TABLET | Freq: Four times a day (QID) | ORAL | Status: DC | PRN
  Administered 2019-02-21 – 2019-02-22 (×2): 1 mg via ORAL
  Filled 2019-02-21 (×3): qty 1

## 2019-02-21 MED ORDER — METHOCARBAMOL 500 MG PO TABS
500.0000 mg | ORAL_TABLET | Freq: Three times a day (TID) | ORAL | Status: DC | PRN
  Administered 2019-02-21 – 2019-02-23 (×3): 500 mg via ORAL
  Filled 2019-02-21 (×3): qty 1

## 2019-02-21 MED ORDER — CLONIDINE HCL 0.1 MG PO TABS
0.1000 mg | ORAL_TABLET | Freq: Four times a day (QID) | ORAL | Status: AC
  Administered 2019-02-21 – 2019-02-22 (×7): 0.1 mg via ORAL
  Filled 2019-02-21 (×9): qty 1

## 2019-02-21 MED ORDER — LOPERAMIDE HCL 2 MG PO CAPS: 2.0000 mg | ORAL_CAPSULE | ORAL | Status: DC | PRN

## 2019-02-21 MED ORDER — ADULT MULTIVITAMIN W/MINERALS CH
1.0000 | ORAL_TABLET | Freq: Every day | ORAL | Status: DC
  Administered 2019-02-22 – 2019-02-23 (×2): 1 via ORAL
  Filled 2019-02-21 (×3): qty 1

## 2019-02-21 MED ORDER — CLONIDINE HCL 0.1 MG PO TABS
0.1000 mg | ORAL_TABLET | ORAL | Status: DC
  Administered 2019-02-23: 08:00:00 0.1 mg via ORAL
  Filled 2019-02-21 (×3): qty 1

## 2019-02-21 MED ORDER — CLONIDINE HCL 0.1 MG PO TABS: 0.1000 mg | ORAL_TABLET | Freq: Every day | ORAL | Status: DC

## 2019-02-21 MED ORDER — NAPROXEN 500 MG PO TABS
500.0000 mg | ORAL_TABLET | Freq: Two times a day (BID) | ORAL | Status: DC | PRN
  Administered 2019-02-21 – 2019-02-23 (×4): 500 mg via ORAL
  Filled 2019-02-21 (×5): qty 1

## 2019-02-21 MED ORDER — LORAZEPAM 1 MG PO TABS
1.0000 mg | ORAL_TABLET | Freq: Once | ORAL | Status: AC
  Administered 2019-02-21: 1 mg via ORAL

## 2019-02-21 MED ORDER — HYDROXYZINE HCL 25 MG PO TABS
25.0000 mg | ORAL_TABLET | Freq: Four times a day (QID) | ORAL | Status: DC | PRN
  Administered 2019-02-22 – 2019-02-23 (×2): 25 mg via ORAL
  Filled 2019-02-21 (×2): qty 1

## 2019-02-21 MED ORDER — DICYCLOMINE HCL 20 MG PO TABS
20.0000 mg | ORAL_TABLET | Freq: Four times a day (QID) | ORAL | Status: DC | PRN
  Administered 2019-02-23: 20 mg via ORAL
  Filled 2019-02-21: qty 1

## 2019-02-21 MED ORDER — CHLORDIAZEPOXIDE HCL 25 MG PO CAPS: 25.0000 mg | ORAL_CAPSULE | Freq: Four times a day (QID) | ORAL | Status: DC | PRN

## 2019-02-21 MED ORDER — QUETIAPINE FUMARATE 50 MG PO TABS
50.0000 mg | ORAL_TABLET | Freq: Every day | ORAL | Status: DC
  Filled 2019-02-21 (×2): qty 1

## 2019-02-21 MED ORDER — VITAMIN B-1 100 MG PO TABS
100.0000 mg | ORAL_TABLET | Freq: Every day | ORAL | Status: DC
  Administered 2019-02-22 – 2019-02-23 (×2): 100 mg via ORAL
  Filled 2019-02-21 (×3): qty 1

## 2019-02-21 MED ORDER — VITAMIN B-1 100 MG PO TABS
100.0000 mg | ORAL_TABLET | Freq: Every day | ORAL | Status: DC
  Filled 2019-02-21 (×2): qty 1
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
## 2019-02-21 NOTE — Progress Notes (Signed)
D.  Pt in bed on approach, no complaint voiced.  Pt has remained in bed, resting with eyes closed, respirations even and unlabored.  No distress noted.  Pt did not get up for evening wrap up group.  A.  Will continue to monitor.  R.  Pt remains safe on the unit.

## 2019-02-21 NOTE — Progress Notes (Signed)
[place] NOVEL CORONAVIRUS (COVID-19) DAILY CHECK-OFF SYMPTOMS - answer yes or no to each - every day NO YES  Have you had a fever in the past 24 hours?  . Fever (Temp > 37.80C / 100F) X   Have you had any of these symptoms in the past 24 hours? . New Cough .  Sore Throat  .  Shortness of Breath .  Difficulty Breathing .  Unexplained Body Aches   X   Have you had any one of these symptoms in the past 24 hours not related to allergies?   . Runny Nose .  Nasal Congestion .  Sneezing   X   If you have had runny nose, nasal congestion, sneezing in the past 24 hours, has it worsened?  X   EXPOSURES - check yes or no X   Have you traveled outside the state in the past 14 days?  X   Have you been in contact with someone with a confirmed diagnosis of COVID-19 or PUI in the past 14 days without wearing appropriate PPE?  X   Have you been living in the same home as a person with confirmed diagnosis of COVID-19 or a PUI (household contact)?    X   Have you been diagnosed with COVID-19?    X              What to do next: Answered NO to all: Answered YES to anything:   Proceed with unit schedule Follow the [facility] Inpatient Flowsheet.   

## 2019-02-21 NOTE — H&P (Addendum)
Psychiatric Admission Assessment Adult  Patient Identification: Brian Osborn MRN:  [id number] Date of Evaluation:  02/21/2019 Chief Complaint:  mdd  polysubstance use disorder  Principal Diagnosis: Bipolar I disorder ([place]) Diagnosis:  Principal Problem:   Bipolar I disorder ([place]) Active Problems:   Benzodiazepine abuse ([place])  History of Present Illness: Patient is a 28 year old Caucasian male who presented to the [facility] ED under IVC.  Patient presented to the ED blurting out statements of the truth as her founding fathers as well as having pressured speech.  Patient does admit to abusing benzodiazepines and using MDMA a couple of days ago.  Patient states that he does have some mental health issues he would like to work on why he was here.  He does report having several days of staying awake or sleeping only a couple hours and still feeling energetic, spending money irrationally, crashing after his numerous days of staying awake, depression, anxiety.  Patient also reports that he does have a substance abuse issue and has been treated with Suboxone, however patient has been abusing benzodiazepines as well as other street drugs and patient is informed of the severity of the risk of using these multiple substances.  Patient is placed on detox protocol.  Patient states he does not feel that he would have any withdrawal symptoms due to the benzos as he states he does not use them that often only 2-3 times a week.  Patient reports that he has had some loss of appetite recently.  Patient currently denies any suicidal or homicidal ideations and denies any hallucinations.  Associated Signs/Symptoms: Depression Symptoms:  depressed mood, insomnia, fatigue, feelings of worthlessness/guilt, loss of energy/fatigue, disturbed sleep, (Hypo) Manic Symptoms:  Elevated Mood, Flight of Ideas, Community education officer, Impulsivity, Irritable Mood, Anxiety Symptoms:  Excessive Worry, Psychotic Symptoms:   Denies PTSD Symptoms: NA Total Time spent with patient: 45 minutes  Past Psychiatric History: Polysubstance abuse, manic feelings for 2-3 years  Is the patient at risk to self? Yes.    Has the patient been a risk to self in the past 6 months? No.  Has the patient been a risk to self within the distant past? No.  Is the patient a risk to others? No.  Has the patient been a risk to others in the past 6 months? No.  Has the patient been a risk to others within the distant past? No.   Prior Inpatient Therapy:   Prior Outpatient Therapy:    Alcohol Screening: 1. How often do you have a drink containing alcohol?: Never 2. How many drinks containing alcohol do you have on a typical day when you are drinking?: 1 or 2 3. How often do you have six or more drinks on one occasion?: Never AUDIT-C Score: 0 4. How often during the last year have you found that you were not able to stop drinking once you had started?: Never 5. How often during the last year have you failed to do what was normally expected from you becasue of drinking?: Never 6. How often during the last year have you needed a first drink in the morning to get yourself going after a heavy drinking session?: Never 7. How often during the last year have you had a feeling of guilt of remorse after drinking?: Never 8. How often during the last year have you been unable to remember what happened the night before because you had been drinking?: Never 9. Have you or someone else been injured as a result of  your drinking?: No 10. Has a relative or friend or a doctor or another health worker been concerned about your drinking or suggested you cut down?: No Alcohol Use Disorder Identification Test Final Score (AUDIT): 0 Substance Abuse History in the last 12 months:  Yes.   Consequences of Substance Abuse: Medical Consequences:  reviewed Legal Consequences:  reviewed Family Consequences:  reviewed Previous Psychotropic Medications: Yes   Psychological Evaluations: No  Past Medical History:  Past Medical History:  Diagnosis Date  . History of drug abuse ([place])    History reviewed. No pertinent surgical history. Family History: History reviewed. No pertinent family history. Family Psychiatric  History: Denies  Tobacco Screening:   Social History:  Social History   Substance and Sexual Activity  Alcohol Use Yes   Comment: socially     Social History   Substance and Sexual Activity  Drug Use Yes  . Types: Methamphetamines   Comment: snorted today    Additional Social History: Marital status: Single Are you sexually active?: Yes What is your sexual orientation?: heterosexual Has your sexual activity been affected by drugs, alcohol, medication, or emotional stress?: n/a Does patient have children?: No                         Allergies:   Allergies  Allergen Reactions  . Bee Venom Hives   Lab Results:  Results for orders placed or performed during the hospital encounter of 02/19/19 (from the past 48 hour(s))  CBC with Differential/Platelet     Status: Abnormal   Collection Time: 02/19/19  5:58 PM  Result Value Ref Range   WBC 6.2 4.0 - 10.5 K/uL   RBC 4.18 (L) 4.22 - 5.81 MIL/uL   Hemoglobin 12.7 (L) 13.0 - 17.0 g/dL   HCT 36.5 (L) 39.0 - 52.0 %   MCV 87.3 80.0 - 100.0 fL   MCH 30.4 26.0 - 34.0 pg   MCHC 34.8 30.0 - 36.0 g/dL   RDW 12.0 11.5 - 15.5 %   Platelets 247 150 - 400 K/uL   nRBC 0.0 0.0 - 0.2 %   Neutrophils Relative % 54 %   Neutro Abs 3.4 1.7 - 7.7 K/uL   Lymphocytes Relative 31 %   Lymphs Abs 1.9 0.7 - 4.0 K/uL   Monocytes Relative 9 %   Monocytes Absolute 0.5 0.1 - 1.0 K/uL   Eosinophils Relative 5 %   Eosinophils Absolute 0.3 0.0 - 0.5 K/uL   Basophils Relative 1 %   Basophils Absolute 0.0 0.0 - 0.1 K/uL   Immature Granulocytes 0 %   Abs Immature Granulocytes 0.01 0.00 - 0.07 K/uL    Comment: Performed at [facility], [address] [place], [place]  [address]  Comprehensive metabolic panel     Status: Abnormal   Collection Time: 02/19/19  5:58 PM  Result Value Ref Range   Sodium 141 135 - 145 mmol/L   Potassium 4.3 3.5 - 5.1 mmol/L   Chloride 106 98 - 111 mmol/L   CO2 27 22 - 32 mmol/L   Glucose, Bld 119 (H) 70 - 99 mg/dL   BUN 16 6 - 20 mg/dL   Creatinine, Ser 1.04 0.61 - 1.24 mg/dL   Calcium 8.7 (L) 8.9 - 10.3 mg/dL   Total Protein 6.3 (L) 6.5 - 8.1 g/dL   Albumin 4.0 3.5 - 5.0 g/dL   AST 31 15 - 41 U/L   ALT 14 0 - 44 U/L  Alkaline Phosphatase 86 38 - 126 U/L   Total Bilirubin 0.2 (L) 0.3 - 1.2 mg/dL   GFR calc non Af Amer >60 >60 mL/min   GFR calc Af Amer >60 >60 mL/min   Anion gap 8 5 - 15    Comment: Performed at [facility], [address] [place], [place] [address]  Troponin I - ONCE - STAT     Status: None   Collection Time: 02/19/19  5:58 PM  Result Value Ref Range   Troponin I <0.03 <0.03 ng/mL    Comment: Performed at [facility], [address] [place], [place] [address]  Urine Drug Screen, Qualitative ([place] only)     Status: Abnormal   Collection Time: 02/19/19  5:58 PM  Result Value Ref Range   Tricyclic, Ur Screen NONE DETECTED NONE DETECTED   Amphetamines, Ur Screen NONE DETECTED NONE DETECTED   MDMA (Ecstasy)Ur Screen POSITIVE (A) NONE DETECTED   Cocaine Metabolite,Ur [place] NONE DETECTED NONE DETECTED   Opiate, Ur Screen NONE DETECTED NONE DETECTED   Phencyclidine (PCP) Ur S NONE DETECTED NONE DETECTED   Cannabinoid 50 Ng, Ur Man POSITIVE (A) NONE DETECTED   Barbiturates, Ur Screen NONE DETECTED NONE DETECTED   Benzodiazepine, Ur Scrn POSITIVE (A) NONE DETECTED   Methadone Scn, Ur NONE DETECTED NONE DETECTED    Comment: (NOTE) Tricyclics + metabolites, urine    Cutoff 1000 ng/mL Amphetamines + metabolites, urine  Cutoff 1000 ng/mL MDMA (Ecstasy), urine              Cutoff 500 ng/mL Cocaine Metabolite, urine          Cutoff 300 ng/mL Opiate + metabolites, urine        Cutoff 300  ng/mL Phencyclidine (PCP), urine         Cutoff 25 ng/mL Cannabinoid, urine                 Cutoff 50 ng/mL Barbiturates + metabolites, urine  Cutoff 200 ng/mL Benzodiazepine, urine              Cutoff 200 ng/mL Methadone, urine                   Cutoff 300 ng/mL The urine drug screen provides only a preliminary, unconfirmed analytical test result and should not be used for non-medical purposes. Clinical consideration and professional judgment should be applied to any positive drug screen result due to possible interfering substances. A more specific alternate chemical method must be used in order to obtain a confirmed analytical result. Gas chromatography / mass spectrometry (GC/MS) is the preferred confirmat ory method. Performed at [facility], [address] [place], [place] [address]   Ethanol     Status: None   Collection Time: 02/19/19  5:58 PM  Result Value Ref Range   Alcohol, Ethyl (B) <10 <10 mg/dL    Comment: (NOTE) Lowest detectable limit for serum alcohol is 10 mg/dL. For medical purposes only. Performed at [facility], [address] [place], [place] [address]   Acetaminophen level     Status: Abnormal   Collection Time: 02/19/19  5:58 PM  Result Value Ref Range   Acetaminophen (Tylenol), Serum <10 (L) 10 - 30 ug/mL    Comment: (NOTE) Therapeutic concentrations vary significantly. A range of 10-30 ug/mL  may be an effective concentration for many patients. However, some  are best treated at concentrations outside of this range. Acetaminophen concentrations >150 ug/mL at 4 hours  after ingestion  and >50 ug/mL at 12 hours after ingestion are often associated with  toxic reactions. Performed at [facility], [address] [place], [place] [address]   Salicylate level     Status: None   Collection Time: 02/19/19  5:58 PM  Result Value Ref Range   Salicylate Lvl <9.0 2.8 - 30.0 mg/dL    Comment: Performed at [facility], [address] [place],  [address]    Blood Alcohol level:  Lab Results  Component Value Date   [facility] <10 02/19/2019   ETH <10 33/00/7622    Metabolic Disorder Labs:  No results found for: HGBA1C, MPG No results found for: PROLACTIN No results found for: CHOL, TRIG, HDL, CHOLHDL, VLDL, LDLCALC  Current Medications: Current Facility-Administered Medications  Medication Dose Route Frequency Provider Last Rate Last Dose  . acetaminophen (TYLENOL) tablet 650 mg  650 mg Oral Q6H PRN Patrecia Pour, NP      . alum & mag hydroxide-simeth (MAALOX/MYLANTA) 200-200-20 MG/5ML suspension 30 mL  30 mL Oral Q4H PRN Patrecia Pour, NP      . chlordiazePOXIDE (LIBRIUM) capsule 25 mg  25 mg Oral Q6H PRN Money, Lowry Ram, FNP      . cloNIDine (CATAPRES) tablet 0.1 mg  0.1 mg Oral QID Money, Darnelle Maffucci B, FNP   0.1 mg at 02/21/19 1021   Followed by  . [START ON 02/23/2019] cloNIDine (CATAPRES) tablet 0.1 mg  0.1 mg Oral BH-qamhs Money, Lowry Ram, FNP       Followed by  . [START ON 02/25/2019] cloNIDine (CATAPRES) tablet 0.1 mg  0.1 mg Oral QAC breakfast Money, Lowry Ram, FNP      . dicyclomine (BENTYL) tablet 20 mg  20 mg Oral Q6H PRN Money, Darnelle Maffucci B, FNP      . diphenhydrAMINE (BENADRYL) injection 50 mg  50 mg Intramuscular Q6H PRN Patrecia Pour, NP      . folic acid (FOLVITE) tablet 1 mg  1 mg Oral Daily Patrecia Pour, NP   1 mg at 02/21/19 0848  . gabapentin (NEURONTIN) capsule 300 mg  300 mg Oral TID Patrecia Pour, NP   300 mg at 02/21/19 6333  . hydrOXYzine (ATARAX/VISTARIL) tablet 25 mg  25 mg Oral Q6H PRN Money, Lowry Ram, FNP      . loperamide (IMODIUM) capsule 2-4 mg  2-4 mg Oral PRN Money, Lowry Ram, FNP      . magnesium hydroxide (MILK OF MAGNESIA) suspension 30 mL  30 mL Oral Daily PRN Patrecia Pour, NP      . methocarbamol (ROBAXIN) tablet 500 mg  500 mg Oral Q8H PRN Money, Lowry Ram, FNP      . multivitamin with minerals tablet 1 tablet  1 tablet Oral Daily Patrecia Pour, NP    1 tablet at 02/21/19 0848  . naproxen (NAPROSYN) tablet 500 mg  500 mg Oral BID PRN Money, Lowry Ram, FNP      . nicotine polacrilex (NICORETTE) gum 2 mg  2 mg Oral PRN Cobos, Myer Peer, MD   2 mg at 02/21/19 1023  . ondansetron (ZOFRAN-ODT) disintegrating tablet 4 mg  4 mg Oral Q6H PRN Money, Lowry Ram, FNP      . QUEtiapine (SEROQUEL) tablet 50 mg  50 mg Oral QHS Money, Lowry Ram, FNP      . [START ON 02/22/2019] thiamine (VITAMIN B-1) tablet 100 mg  100 mg Oral Daily Money, Lowry Ram, FNP  PTA Medications: Medications Prior to Admission  Medication Sig Dispense Refill Last Dose  . acetaminophen (TYLENOL) 325 MG tablet Take 650 mg by mouth every 6 (six) hours as needed for mild pain or headache.     . buprenorphine-naloxone (SUBOXONE) 8-2 mg SUBL SL tablet Place 1.5 tablets under the tongue daily.       Musculoskeletal: Strength & Muscle Tone: within normal limits Gait & Station: normal Patient leans: N/A  Psychiatric Specialty Exam: Physical Exam  Nursing note and vitals reviewed. Constitutional: He is oriented to person, place, and time. He appears well-developed and well-nourished.  Cardiovascular: Normal rate.  Respiratory: Effort normal.  Musculoskeletal: Normal range of motion.  Neurological: He is alert and oriented to person, place, and time.  Skin: Skin is warm.    Review of Systems  Constitutional: Negative.   HENT: Negative.   Eyes: Negative.   Respiratory: Negative.   Cardiovascular: Negative.   Gastrointestinal: Negative.   Genitourinary: Negative.   Musculoskeletal: Negative.   Skin: Negative.   Neurological: Negative.   Endo/Heme/Allergies: Negative.   Psychiatric/Behavioral: Positive for depression and substance abuse. The patient is nervous/anxious and has insomnia.     Blood pressure (!) 142/87, pulse 85, temperature 98.3 F (36.8 C), temperature source Oral, resp. rate 16, height 6' 2"  (1.88 m), weight 76.2 kg, SpO2 99 %.Body mass index is 21.57  kg/m.  General Appearance: Casual  Eye Contact:  Fair  Speech:  Clear and Coherent and Normal Rate  Volume:  Normal  Mood:  Irritable  Affect:  Congruent  Thought Process:  Coherent and Descriptions of Associations: Intact  Orientation:  Full (Time, Place, and Person)  Thought Content:  WDL  Suicidal Thoughts:  No  Homicidal Thoughts:  No  Memory:  Immediate;   Good Recent;   Fair Remote;   Good  Judgement:  Fair  Insight:  Fair  Psychomotor Activity:  Normal  Concentration:  Concentration: Good and Attention Span: Good  Recall:  Good  Fund of Knowledge:  Good  Language:  Good  Akathisia:  No  Handed:  Right  AIMS (if indicated):     Assets:  Communication Skills Desire for Improvement Physical Health Resilience Social Support Transportation  ADL's:  Intact  Cognition:  WNL  Sleep:  Number of Hours: 6.25    Treatment Plan Summary: Daily contact with patient to assess and evaluate symptoms and progress in treatment and Medication management  Observation Level/Precautions:  15 minute checks  Laboratory:  Reviewed  Psychotherapy: Group therapy  Medications: See [facility]  Consultations: As needed  Discharge Concerns: Compliance  Estimated LOS: 5 to 7 days  Other: Admit to 300 hall   Physician Treatment Plan for Primary Diagnosis: Bipolar I disorder ([place]) Long Term Goal(s): Improvement in symptoms so as ready for discharge  Short Term Goals: Ability to identify and develop effective coping behaviors will improve, Ability to maintain clinical measurements within normal limits will improve, Compliance with prescribed medications will improve and Ability to identify triggers associated with substance abuse/mental health issues will improve  Physician Treatment Plan for Secondary Diagnosis: Principal Problem:   Bipolar I disorder ([place]) Active Problems:   Benzodiazepine abuse ([place])  Long Term Goal(s): Improvement in symptoms so as ready for discharge  Short Term Goals:  Ability to identify changes in lifestyle to reduce recurrence of condition will improve, Ability to verbalize feelings will improve, Ability to disclose and discuss suicidal ideas and Ability to demonstrate self-control will improve  I certify that inpatient services  furnished can reasonably be expected to improve the patient's condition.    Lewis Shock, FNP 5/16/202011:45 AM   I have discussed case with NP and have met with patient  Agree with NP note and assessment  28, single, no children, lives with his father, currently unemployed . Presented to ED under commitment on 5/14. Initially presented disorganized, agitated, with loud, pressured speech , manic type behavior, voicing suicidal ideations, with cuts on hands reportedly from going through trash cans. Patient has a history of substance abuse, states he had mainly been using " clona zilam", which he states is a benzodiazepine substance he was getting from friends, but is " too strong to be legal in [place]". He states he was using this substance every two to three days, and last used 3 days ago . He also endorses recent use of MDMA and Cannabis ( reports last use 5-6 days ago) , and states he was being prescribed Buprenorphine at 16 mgr daily for several months and a prior history of illicit drug abuse.Denies alcohol use. He has prior history of methamphetamine abuse, but states he has not used recently. Admission UDS positive for BZDs, MDMA, Cannabis, admission BAL negative. Currently patient presents calm, without agitation or pressured speech. States " the drugs just mess me up, I really need to stop using drugs ". He states his behaviors, symptoms are substance induced . Denies prior psychiatric admissions, describes history of depressive episodes in the past but characterizes as mild, denies history of mania. Denies psychosis. Denies history of PTSD.  States " in general I am even keeled and stable when I am not on drugs ". Denies history of  suicidal attempts. Denies homicidal or violent ideations. Denies medical illnesses , NKDA. Denies history of WDL seizures .  Was prescribed Buprenorphine 8 mgr - 1.5 tablets SL daily. No other prescribed medications prior to admission  Currently describes some opiate WDL symptoms- muscular aches, nausea, yawning, lacrimation . At this time does not present tremulous or diaphoretic, no psychomotor restlessness, BP 142/87, pulse 85    Dx- Polysubstance Dependence  Clonidine /opiate detox protocol to minimize opiate WDL symptoms Ativan detox protocol ( PRN) to minimize risk of BZD withdrawal Continue Neurontin 300 mgrs TID Currently will not start standing antipsychotic medication, as patient presents much improved without psychotic symptoms or agitated behaviors in the context of sobriety, and describes symptoms as substance induced .

## 2019-02-21 NOTE — [facility] Group Notes (Signed)
[facility] Group Notes:  (Nursing)  Date:  02/21/2019  Time: 130 PM Type of Therapy:  Nurse Education  Participation Level:  Active  Participation Quality:  Appropriate and Attentive  Affect:  Appropriate  Cognitive:  Appropriate  Insight:  Appropriate  Engagement in Group:  Engaged  Modes of Intervention:  Education  Summary of Progress/Problems: Nurse led Life Skills Group  Shela Nevin 02/21/2019, 1:53 PM

## 2019-02-21 NOTE — [facility] Suicide Risk Assessment (Addendum)
[facility]   Nursing information obtained from:  Patient Demographic factors:  Male, Unemployed, Low socioeconomic status, Caucasian Current Mental Status:  NA Loss Factors:  Decline in physical health Historical Factors:  Impulsivity Risk Reduction Factors:  Sense of responsibility to family, Living with another person, especially a relative, Positive social support, Positive therapeutic relationship  Total Time spent with patient: 45 minutes Principal Problem:  Polysubstance Dependence, Substance Induced Mood Disorder  Diagnosis:  Active Problems:   Major depressive disorder, recurrent severe without psychotic features (HCC)  Subjective Data:   Continued Clinical Symptoms:  Alcohol Use Disorder Identification Test Final Score (AUDIT): 0 The "Alcohol Use Disorders Identification Test", Guidelines for Use in Primary Care, Second Edition.  World Science writerHealth Organization [facility](WHO). Score between 0-7:  no or low risk or alcohol related problems. Score between 8-15:  moderate risk of alcohol related problems. Score between 16-19:  high risk of alcohol related problems. Score 20 or above:  warrants further diagnostic evaluation for alcohol dependence and treatment.   CLINICAL FACTORS:  28, single, no children, lives with his father, currently unemployed . Presented to ED under commitment on 5/14. Initially presented disorganized, agitated, with loud, pressured speech , manic type behavior, voicing suicidal ideations, with cuts on hands reportedly from going through trash cans. Patient has a history of substance abuse, states he had mainly been using " clona zilam", which he states is a benzodiazepine substance he was getting from friends, but is " too strong to be legal in [place]". He states he was using this substance every two to three days, and last used 3 days ago . He also endorses recent use of MDMA and Cannabis ( reports last use 5-6 days ago) , and states he was being  prescribed Buprenorphine at 16 mgr daily for several months and a prior history of illicit drug abuse.Denies alcohol use. He has prior history of methamphetamine abuse, but states he has not used recently. Admission UDS positive for BZDs, MDMA, Cannabis, admission BAL negative. Currently patient presents calm, without agitation or pressured speech. States " the drugs just mess me up, I really need to stop using drugs ". He states his behaviors, symptoms are substance induced . Denies prior psychiatric admissions, describes history of depressive episodes in the past but characterizes as mild, denies history of mania. Denies psychosis. Denies history of PTSD.  States " in general I am even keeled and stable when I am not on drugs ". Denies history of suicidal attempts. Denies homicidal or violent ideations. Denies medical illnesses , NKDA. Denies history of WDL seizures .  Was prescribed Buprenorphine 8 mgr - 1.5 tablets SL daily. No other prescribed medications prior to admission  Currently describes some opiate WDL symptoms- muscular aches, nausea, yawning, lacrimation . At this time does not present tremulous or diaphoretic, no psychomotor restlessness, BP 142/87, pulse 85    Dx- Polysubstance Dependence  Clonidine /opiate detox protocol to minimize opiate WDL symptoms Ativan detox protocol ( PRN) to minimize risk of BZD withdrawal Continue Neurontin 300 mgrs TID Currently will not start standing antipsychotic medication, as patient presents much improved without psychotic symptoms or agitated behaviors in the context of sobriety, and describes symptoms as substance induced .  Plan- Inpatient admission Musculoskeletal: Strength & Muscle Tone: within normal limits no current tremors or diaphoresis, no restlessness or psychomotor agitation Gait & Station: normal Patient leans: N/A  Psychiatric Specialty Exam: Physical Exam  ROSno fever , no chills, no cough, no shortness  of breath, no vomiting,  no rash  Blood pressure (!) 142/87, pulse 85, temperature 98.3 F (36.8 C), temperature source Oral, resp. rate 16, height 6\' 2"  (1.88 m), weight 76.2 kg, SpO2 99 %.Body mass index is 21.57 kg/m.  General Appearance: Fairly Groomed  Eye Contact:  Fair  Speech:  Normal Rate, subtly dysarthric at times   Volume:  Normal  Mood:  reports mood is "positive, hopeful"  Affect:  appropriate  Thought Process:  Linear and Descriptions of Associations: Intact  Orientation:  Other:  fully alert and attentive. Oriented x 3   Thought Content:  no hallucinations, no delusions, not internally preoccupied   Suicidal Thoughts:  No denies suicidal or self injurious ideations, denies homicidal or violent ideations  Homicidal Thoughts:  No  Memory:  recent and remote grossly intact   Judgement:  Fair  Insight:  Fair  Psychomotor Activity:  Normal- no current tremors or diaphoresis  Concentration:  Concentration: Good and Attention Span: Good  Recall:  Good  Fund of Knowledge:  Good  Language:  Good  Akathisia:  Negative  Handed:  Right  AIMS (if indicated):     Assets:  Desire for Improvement Resilience  ADL's:  Intact  Cognition:  WNL  Sleep:  Number of Hours: 6.25      COGNITIVE FEATURES THAT CONTRIBUTE TO RISK:  Closed-mindedness and Loss of executive function    SUICIDE RISK:   Moderate:  Frequent suicidal ideation with limited intensity, and duration, some specificity in terms of plans, no associated intent, good self-control, limited dysphoria/symptomatology, some risk factors present, and identifiable protective factors, including available and accessible social support.  PLAN OF CARE: Patient will be admitted to inpatient psychiatric unit for stabilization and safety. Will provide and encourage milieu participation. Provide medication management and maked adjustments as needed. Will provide medications to address WDL symptoms-   Will follow daily.    I certify that inpatient services  furnished can reasonably be expected to improve the patient's condition.   Craige Cotta, MD 02/21/2019, 11:33 AM

## 2019-02-21 NOTE — Progress Notes (Signed)
Psychoeducational Group Note  Date:  02/21/2019 Time:  2030 Group Topic/Focus:  wrap up group  Participation Level: Did Not Attend  Participation Quality:  Not Applicable  Affect:  Not Applicable  Cognitive:  Not Applicable  Insight:  Not Applicable  Engagement in Group: Not Applicable  Additional Comments:  Pt was notified that group was beginning but remained in bed.   Marcille Buffy 02/21/2019, 9:17 PM

## 2019-02-21 NOTE — Progress Notes (Addendum)
D. Pt presents with a flat affect/ anxious mood, calm and cooperative behavior- friendly during interactions. Per pt's self inventory, pt rates his depression, hopelessness and anxiety a 4/0/6, respectively. Pt writes on self inventory- "being social on problems really helped" and "keep clean and keep strong will". Pt exhibits some mild withdrawal symptoms (nausea, yawning)  Pt currently denies SI/HI and AVH A. Labs and vitals monitored. Pt compliant with medications. Pt supported emotionally and encouraged to express concerns and ask questions.   R. Pt remains safe with 15 minute checks. Will continue POC.
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
## 2019-02-21 NOTE — Progress Notes (Signed)
Pt up and took night medications, withdrawal symptoms include anxiety, tremors, restlessness at this time but are presently mild.  Pt returned to bed immediately after taking medication.  No further complaints voiced.

## 2019-02-22 DIAGNOSIS — F319 Bipolar disorder, unspecified: Secondary | ICD-10-CM | POA: Diagnosis not present

## 2019-02-22 NOTE — Progress Notes (Addendum)
[facility]  02/22/2019 11:07 AM Brian Osborn  MRN:  [id number]   Subjective: Patient reports today that he is feeling much better.  He states that he is having some withdrawal symptoms and is having some body aches but that is it.  Patient states now that he is very happy that he came to the hospital.  He states he is glad that he was not restarted on the Suboxone when he got here because he realizes now that he needs to get clean and sober.  Patient states that he has a lot of support and that he knows that he can stay clean and sober if he just tries.  He reports that [place] that [place] and when his friends has been with him for 3 years and he would be a sponsor.  Patient states he does not want to go to rehab he does want to follow-up with outpatient resources when he leaves.  Patient reports that he slept well last night and his appetite has been improved.  Patient denies any suicidal or homicidal ideations and states that he feels a little sad today but denies any anxiety.  Objective: Patient's chart and findings reviewed and discussed with treatment team.  Patient presents in his room lying in his bed, but patient is awake and alert.  Patient is pleasant, calm, and cooperative today.  Patient has been seen interacting with peers and staff appropriately and has been attending groups.  Patient does seem to show significant improvement since yesterday.  Principal Problem: Bipolar I disorder (HCC) Diagnosis: Principal Problem:   Bipolar I disorder (HCC) Active Problems:   Benzodiazepine abuse (HCC)  Total Time spent with patient: 20 minutes  Past Psychiatric History: See H&P  Past Medical History:  Past Medical History:  Diagnosis Date  . History of drug abuse (HCC)    History reviewed. No pertinent surgical history. Family History: History reviewed. No pertinent family history. Family Psychiatric  History: See H&P Social History:  Social History   Substance  and Sexual Activity  Alcohol Use Yes   Comment: socially     Social History   Substance and Sexual Activity  Drug Use Yes  . Types: Methamphetamines   Comment: snorted today    Social History   Socioeconomic History  . Marital status: Single    Spouse name: Not on file  . Number of children: Not on file  . Years of education: Not on file  . Highest education level: Not on file  Occupational History  . Not on file  Social Needs  . Financial resource strain: Not on file  . Food insecurity:    Worry: Not on file    Inability: Not on file  . Transportation needs:    Medical: Not on file    Non-medical: Not on file  Tobacco Use  . Smoking status: Current Every Day Smoker    Packs/day: 1.00    Types: Cigarettes  . Smokeless tobacco: Never Used  Substance and Sexual Activity  . Alcohol use: Yes    Comment: socially  . Drug use: Yes    Types: Methamphetamines    Comment: snorted today  . Sexual activity: Yes    Birth control/protection: None  Lifestyle  . Physical activity:    Days per week: Not on file    Minutes per session: Not on file  . Stress: Not on file  Relationships  . Social connections:    Talks  on phone: Not on file    Gets together: Not on file    Attends religious service: Not on file    Active member of club or organization: Not on file    Attends meetings of clubs or organizations: Not on file    Relationship status: Not on file  Other Topics Concern  . Not on file  Social History Narrative  . Not on file   Additional Social History:                         Sleep: Good  Appetite:  Good  Current Medications: Current Facility-Administered Medications  Medication Dose Route Frequency Provider Last Rate Last Dose  . acetaminophen (TYLENOL) tablet 650 mg  650 mg Oral Q6H PRN Charm Rings, NP   650 mg at 02/22/19 1043  . alum & mag hydroxide-simeth (MAALOX/MYLANTA) 200-200-20 MG/5ML suspension 30 mL  30 mL Oral Q4H PRN Charm Rings, NP      . cloNIDine (CATAPRES) tablet 0.1 mg  0.1 mg Oral QID Money, Feliz Beam B, FNP   0.1 mg at 02/22/19 0813   Followed by  . [START ON 02/23/2019] cloNIDine (CATAPRES) tablet 0.1 mg  0.1 mg Oral BH-qamhs Money, Gerlene Burdock, FNP       Followed by  . [START ON 02/25/2019] cloNIDine (CATAPRES) tablet 0.1 mg  0.1 mg Oral QAC breakfast Money, Feliz Beam B, FNP      . dicyclomine (BENTYL) tablet 20 mg  20 mg Oral Q6H PRN Money, Gerlene Burdock, FNP      . folic acid (FOLVITE) tablet 1 mg  1 mg Oral Daily Charm Rings, NP   1 mg at 02/22/19 0813  . gabapentin (NEURONTIN) capsule 300 mg  300 mg Oral TID Charm Rings, NP   300 mg at 02/22/19 9326  . hydrOXYzine (ATARAX/VISTARIL) tablet 25 mg  25 mg Oral Q6H PRN Cobos, Rockey Situ, MD      . loperamide (IMODIUM) capsule 2-4 mg  2-4 mg Oral PRN Cobos, Rockey Situ, MD      . LORazepam (ATIVAN) tablet 1 mg  1 mg Oral Q6H PRN Cobos, Rockey Situ, MD   1 mg at 02/21/19 2337  . magnesium hydroxide (MILK OF MAGNESIA) suspension 30 mL  30 mL Oral Daily PRN Charm Rings, NP      . methocarbamol (ROBAXIN) tablet 500 mg  500 mg Oral Q8H PRN Money, Gerlene Burdock, FNP   500 mg at 02/21/19 2335  . multivitamin with minerals tablet 1 tablet  1 tablet Oral Daily Cobos, Rockey Situ, MD   1 tablet at 02/22/19 0813  . naproxen (NAPROSYN) tablet 500 mg  500 mg Oral BID PRN Money, Gerlene Burdock, FNP   500 mg at 02/22/19 7124  . nicotine polacrilex (NICORETTE) gum 2 mg  2 mg Oral PRN Cobos, Rockey Situ, MD   2 mg at 02/22/19 0813  . ondansetron (ZOFRAN-ODT) disintegrating tablet 4 mg  4 mg Oral Q6H PRN Cobos, Fernando A, MD      . thiamine (VITAMIN B-1) tablet 100 mg  100 mg Oral Daily Cobos, Rockey Situ, MD   100 mg at 02/22/19 0813    Lab Results: No results found for this or any previous visit (from the past 48 hour(s)).  Blood Alcohol level:  Lab Results  Component Value Date   ETH <10 02/19/2019   ETH <10 01/21/2019    Metabolic Disorder Labs: No results found  for: HGBA1C,  MPG No results found for: PROLACTIN No results found for: CHOL, TRIG, HDL, CHOLHDL, VLDL, LDLCALC  Physical Findings: AIMS: Facial and Oral Movements Muscles of Facial Expression: None, normal Lips and Perioral Area: None, normal Jaw: None, normal Tongue: None, normal,Extremity Movements Upper (arms, wrists, hands, fingers): None, normal Lower (legs, knees, ankles, toes): None, normal, Trunk Movements Neck, shoulders, hips: None, normal, Overall Severity Severity of abnormal movements (highest score from questions above): None, normal Incapacitation due to abnormal movements: None, normal Patient's awareness of abnormal movements (rate only patient's report): No Awareness, Dental Status Current problems with teeth and/or dentures?: No Does patient usually wear dentures?: No  CIWA:  CIWA-Ar Total: 6 COWS:  COWS Total Score: 4  Musculoskeletal: Strength & Muscle Tone: within normal limits Gait & Station: normal Patient leans: N/A  Psychiatric Specialty Exam: Physical Exam  ROS  Blood pressure 135/87, pulse 81, temperature (!) 97.5 F (36.4 C), temperature source Oral, resp. rate 16, height  (1.88 m), weight 76.2 kg, SpO2 99 %.Body mass index is 21.57 kg/m.  General Appearance: Casual  Eye Contact:  Good  Speech:  Clear and Coherent and Normal Rate  Volume:  Normal  Mood:  Euthymic  Affect:  Congruent  Thought Process:  Coherent and Descriptions of Associations: Intact  Orientation:  Full (Time, Place, and Person)  Thought Content:  WDL  Suicidal Thoughts:  No  Homicidal Thoughts:  No  Memory:  Immediate;   Good Recent;   Good Remote;   Good  Judgement:  Fair  Insight:  Fair  Psychomotor Activity:  Normal  Concentration:  Concentration: Good and Attention Span: Good  Recall:  Good  Fund of Knowledge:  Good  Language:  Good  Akathisia:  No  Handed:  Right  AIMS (if indicated):     Assets:  Communication Skills Desire for Improvement Financial  Resources/Insurance Housing Physical Health Social Support Transportation  ADL's:  Intact  Cognition:  WNL  Sleep:  Number of Hours: 5.5   Problems addressed Bipolar 1 disorder Benzodiazepine abuse  Treatment Plan Summary: Daily contact with patient to assess and evaluate symptoms and progress in treatment, Medication management and Plan is to: Continue clonidine detox protocol Continue Neurontin 300 mg p.o. 3 times daily for substance abuse Continue Vistaril 25 mg p.o. every 6 hours as needed for anxiety Continue Ativan as needed detox protocol for CIWA greater than 10 Encourage group therapy participation CSW to assist with disposition Continue every 15 and safety checks  Gerlene Burdock Money, FNP 02/22/2019, 11:07 AM   Attest to NP Progress Note

## 2019-02-22 NOTE — [facility] Group Notes (Signed)
[facility] LCSW Group Therapy Note  02/22/2019  10:00-11:00AM  Type of Therapy and Topic:  Group Therapy:  Adding Supports Including Yourself  Participation Level:  Active   Description of Group:  Patients in this group were introduced to the concept that additional supports including self-support are an essential part of recovery.  Patients listed what supports they believe they need to add to their lives to achieve their goals at discharge, and they listed such things as therapist, family, doctor, support groups, and service dog.  CSW described the  continuum of mental health/substance abuse services available and the group discussed the differences among these including support group, therapy group, 12-step group, Doctor, hospital, Secondary school teacher, and such.  A song entitled "My Own Hero" was played and a group discussion ensued in which patients stated they could relate to the song and it inspired them to realize they have be willing to help themselves in order to succeed, because other people cannot achieve sobriety or stability for them.  A song was played called "I Am Enough" which led to a discussion about being willing to believe we are worth the effort of being a self-support.  Group members expressed appreciation for each other.  Therapeutic Goals: 1)  demonstrate the importance of being a key part of one's own support system 2)  discuss various available supports 3)  encourage patient to use music as part of their self-support and focus on goals 4)  elicit ideas from patients about supports that need to be added   Summary of Patient Progress:  The patient expressed a support that he wants to deal with is his unhealthy supports, because he knows so many people, and they are always expectant that he can "hook them up" with drugs.  He wants to figure out how to eliminate those contacts from his life.  He stated he has wanted to be clean from drugs for 4 years and wants to  get help, but has not known how to approach this problem with being recognized and called on wherever he goes.  He stated he has already changed his telephone number and his Facebook page so that people will not be able to contact him.   Therapeutic Modalities:   Motivational Interviewing Activity  Lynnell Chad

## 2019-02-22 NOTE — [facility] Suicide Risk Assessment (Signed)
[facility] INPATIENT:  Family/Significant Other Suicide Prevention Education  Suicide Prevention Education:  Education Completed;  Brian Osborn [phone],  (name of family member/significant other) has been identified by the patient as the family member/significant other with whom the patient will be residing, and identified as the person(s) who will aid the patient in the event of a mental health crisis (suicidal ideations/suicide attempt).  With written consent from the patient, the family member/significant other has been provided the following suicide prevention education, prior to the and/or following the discharge of the patient.  Mother stated that she is not sure if patient is going to be allowed to return to live with his father at discharge.  He cannot live with her due to lease restrictions and his grandmother already living with mother.  Mother reported finding a brown powder which she suspected was heroin at her house, threw it away.  The suicide prevention education provided includes the following:  Suicide risk factors  Suicide prevention and interventions  National Suicide Hotline telephone number  [facility] assessment telephone number  [facility] Emergency Assistance 911  [facility] and/or Residential Mobile Crisis Unit telephone number  Request made of family/significant other to:  Remove weapons (e.g., guns, rifles, knives), all items previously/currently identified as safety concern.    Remove drugs/medications (over-the-counter, prescriptions, illicit drugs), all items previously/currently identified as a safety concern.  The family member/significant other verbalizes understanding of the suicide prevention education information provided.  The family member/significant other agrees to remove the items of safety concern listed above.  Brian Osborn 02/22/2019, 9:24 AM

## 2019-02-22 NOTE — Progress Notes (Signed)
Pt said his day was a 8. The one positive thing that happen to him today he got a chance to talk to people today.

## 2019-02-22 NOTE — Progress Notes (Signed)
D. Pt has been calm and cooperative today- visible in the milieu- interacting well with peers. Per pt's self inventory this am, pt rated his depression, hopelessness and anxiety a 2/2/3, respectively. Pt wrote that his goal today is "keep road clear", and "talk to friend here". Pt currently denies SI/HI and AVH  A. Labs and vitals monitored. Pt compliant with medications. Pt supported emotionally and encouraged to express concerns and ask questions.   R. Pt remains safe with 15 minute checks. Will continue POC.

## 2019-02-22 NOTE — Progress Notes (Signed)
[place] NOVEL CORONAVIRUS (COVID-19) DAILY CHECK-OFF SYMPTOMS - answer yes or no to each - every day NO YES  Have you had a fever in the past 24 hours?  . Fever (Temp > 37.80C / 100F) X   Have you had any of these symptoms in the past 24 hours? . New Cough .  Sore Throat  .  Shortness of Breath .  Difficulty Breathing .  Unexplained Body Aches   X   Have you had any one of these symptoms in the past 24 hours not related to allergies?   . Runny Nose .  Nasal Congestion .  Sneezing   X   If you have had runny nose, nasal congestion, sneezing in the past 24 hours, has it worsened?  X   EXPOSURES - check yes or no X   Have you traveled outside the state in the past 14 days?  X   Have you been in contact with someone with a confirmed diagnosis of COVID-19 or PUI in the past 14 days without wearing appropriate PPE?  X   Have you been living in the same home as a person with confirmed diagnosis of COVID-19 or a PUI (household contact)?    X   Have you been diagnosed with COVID-19?    X              What to do next: Answered NO to all: Answered YES to anything:   Proceed with unit schedule Follow the [facility] Inpatient Flowsheet.   

## 2019-02-23 DIAGNOSIS — F319 Bipolar disorder, unspecified: Secondary | ICD-10-CM | POA: Diagnosis not present

## 2019-02-23 MED ORDER — GABAPENTIN 300 MG PO CAPS: 300.0000 mg | ORAL_CAPSULE | Freq: Three times a day (TID) | ORAL | 2 refills | Status: AC

## 2019-02-23 NOTE — Discharge Summary (Signed)
Physician Discharge Summary Note  Patient:  Brian Osborn is an 28 y.o., male MRN:  [id number] DOB:  09-14-1991 Patient phone:  [phone] (home)  Patient address:   [address] [place] [address],  Total Time spent with patient: 15 minutes  Date of Admission:  02/20/2019 Date of Discharge: 02/23/19  Reason for Admission:  Suicidal statements with polysubstance abuse  Principal Problem: Bipolar I disorder [facility]) Discharge Diagnoses: Principal Problem:   Bipolar I disorder (HCC) Active Problems:   Benzodiazepine abuse (HCC)   Past Psychiatric History: Polysubstance abuse, manic feelings for 2-3 years  Past Medical History:  Past Medical History:  Diagnosis Date  . History of drug abuse (HCC)    History reviewed. No pertinent surgical history. Family History: History reviewed. No pertinent family history. Family Psychiatric  History: Denies Social History:  Social History   Substance and Sexual Activity  Alcohol Use Yes   Comment: socially     Social History   Substance and Sexual Activity  Drug Use Yes  . Types: Methamphetamines   Comment: snorted today    Social History   Socioeconomic History  . Marital status: Single    Spouse name: Not on file  . Number of children: Not on file  . Years of education: Not on file  . Highest education level: Not on file  Occupational History  . Not on file  Social Needs  . Financial resource strain: Not on file  . Food insecurity:    Worry: Not on file    Inability: Not on file  . Transportation needs:    Medical: Not on file    Non-medical: Not on file  Tobacco Use  . Smoking status: Current Every Day Smoker    Packs/day: 1.00    Types: Cigarettes  . Smokeless tobacco: Never Used  Substance and Sexual Activity  . Alcohol use: Yes    Comment: socially  . Drug use: Yes    Types: Methamphetamines    Comment: snorted today  . Sexual activity: Yes    Birth control/protection: None  Lifestyle  . Physical  activity:    Days per week: Not on file    Minutes per session: Not on file  . Stress: Not on file  Relationships  . Social connections:    Talks on phone: Not on file    Gets together: Not on file    Attends religious service: Not on file    Active member of club or organization: Not on file    Attends meetings of clubs or organizations: Not on file    Relationship status: Not on file  Other Topics Concern  . Not on file  Social History Narrative  . Not on file    Hospital Course:  From admission assessment: Brian Osborn is an 28 y.o. male who presented to the ED with law enforcement under IVC from [facility] due to reports of him disclosing suicidal ideation to staff at a restaurant. Upon assessment, patient was calm and responsive - cooperative with IM injections from his nurse.  As time progressed, the patient became more irritable and agitated and started yelling.  He said "y'all can't keep me here against my will" and "I will find a way out of here - I will go up through the ceiling and get out." Brian Osborn denied SI/HI or AVH, though, he is clear that he can visually see "society is going to shit." He was apparently speaking nonsensically to the ED triage nurse. Patient  stated he is here at [facility] because "I got picked by these assholes who called the police on me becaue I cut my hands and they took me to [facility]."  While there, they said they were putting him [under commitment] and "made me see the doctor on the camera so they could get me to say stuff to make them send me here." When asked if he's thinking about suicide or homicide, he denied and stated "My body hurts, and that's what I'm thinking about."  When asked about violent behavior, he states "If I get in a fifth with a friend, I don't even want to hit them back."  On observation, there did not appear to be any new lacerations.  IVC states there were cuts from him digging in the trash can to get drugs. Patient reports a long history of drug use.  He  is currently on Suboxone maintenance and is loudly requesting buprenorphine. Opioid use initiated in 2006 and continued until 2008.  He began using buprenorphine illicitly at that time and has been on prescribed maintenance medication for several years. He uses benzodiazepines illicitly though he states he needs them legitimately.  He stated "Xanax is too sedating.  I like clonazepam, it decreases the mania and the snappiness.  It gives me a good balance." Patient reports IV cocaine use at ages [age].  Last use of cannabis was 3-4 years ago. Last use of alcohol was "I don't like it - I don't know." Recent substance use (in the past 72 hours) =  Klonopin, Suboxone, and possibly other non-medical use of prescription medications. He reports sleeping well, 8-9 hours per night.  His appetite was good - he was devouring his his meal during the assessment - even while he was receiving IM medications.  From admission H&P: Patient is a 28 year old Caucasian male who presented to the [facility] ED under IVC.  Patient presented to the ED blurting out statements of the truth as her founding fathers as well as having pressured speech.  Patient does admit to abusing benzodiazepines and using MDMA a couple of days ago.  Patient states that he does have some mental health issues he would like to work on why he was here.  He does report having several days of staying awake or sleeping only a couple hours and still feeling energetic, spending money irrationally, crashing after his numerous days of staying awake, depression, anxiety.  Patient also reports that he does have a substance abuse issue and has been treated with Suboxone, however patient has been abusing benzodiazepines as well as other street drugs and patient is informed of the severity of the risk of using these multiple substances.  Patient is placed on detox protocol.  Patient states he does not feel that he would have any withdrawal symptoms due to the benzos as he  states he does not use them that often only 2-3 times a week.  Patient reports that he has had some loss of appetite recently.  Patient currently denies any suicidal or homicidal ideations and denies any hallucinations.  Brian Osborn was admitted for suicidal statements, agitation with polysubstance abuse. UDS positive for BZDs, ecstasy, and THC. BAL<10. Suboxone was not continued due to substance use. Clonidine COWS protocol was started for opioid withdrawal. Neurontin was started for withdrawal. He participated in group therapy on the unit. He remained on the [facility] unit for 3 days. He stabilized with medication and therapy. He was discharged on the medications listed below. He has shown  improvement with improved mood, affect, sleep, appetite, and interaction. He denies any SI/HI/AVH and contracts for safety. [facility]). He is provided with prescriptions and medication samples upon discharge. The sheriff's department is picking him up for discharge home.  Physical Findings: AIMS: Facial and Oral Movements Muscles of Facial Expression: None, normal Lips and Perioral Area: None, normal Jaw: None, normal Tongue: None, normal,Extremity Movements Upper (arms, wrists, hands, fingers): None, normal Lower (legs, knees, ankles, toes): None, normal, Trunk Movements Neck, shoulders, hips: None, normal, Overall Severity Severity of abnormal movements (highest score from questions above): None, normal Incapacitation due to abnormal movements: None, normal Patient's awareness of abnormal movements (rate only patient's report): No Awareness, Dental Status Current problems with teeth and/or dentures?: No Does patient usually wear dentures?: No  CIWA:  CIWA-Ar Total: 10 COWS:  COWS Total Score: 0  Musculoskeletal: Strength & Muscle Tone: within normal limits Gait & Station: normal Patient leans: N/A  Psychiatric Specialty Exam: Physical Exam  Nursing note and vitals  reviewed. Constitutional: He is oriented to person, place, and time. He appears well-developed and well-nourished.  Cardiovascular: Normal rate.  Respiratory: Effort normal.  Neurological: He is alert and oriented to person, place, and time.    Review of Systems  Constitutional: Negative.   Psychiatric/Behavioral: Positive for substance abuse (BZDs, ecstasy, THC, ETOH, opioids). Negative for depression, hallucinations and suicidal ideas. The patient is not nervous/anxious and does not have insomnia.     Blood pressure 100/62, pulse 97, temperature 98 F (36.7 C), temperature source Oral, resp. rate 16, height 6\' 2"  (1.88 m), weight 76.2 kg, SpO2 100 %.Body mass index is 21.57 kg/m.  See MD's discharge SRA     Have you used any form of tobacco in the last 30 days? (Cigarettes, Smokeless Tobacco, Cigars, and/or Pipes): Yes  Has this patient used any form of tobacco in the last 30 days? (Cigarettes, Smokeless Tobacco, Cigars, and/or Pipes)Yes, A prescription for an FDA-approved tobacco cessation medication was offered at discharge and the patient refused  Blood Alcohol level:  Lab Results  Component Value Date   ETH <10 02/19/2019   ETH <10 01/21/2019    Metabolic Disorder Labs:  No results found for: HGBA1C, MPG No results found for: PROLACTIN No results found for: CHOL, TRIG, HDL, CHOLHDL, VLDL, LDLCALC  See Psychiatric Specialty Exam and Suicide Risk Assessment completed by Attending Physician prior to discharge.  Discharge destination:  Home  Is patient on multiple antipsychotic therapies at discharge:  No   Has Patient had three or more failed trials of antipsychotic monotherapy by history:  No  Recommended Plan for Multiple Antipsychotic Therapies: NA   Allergies as of 02/23/2019      Reactions   Bee Venom Hives      Medication List    STOP taking these medications   acetaminophen 325 MG tablet Commonly known as:  TYLENOL   buprenorphine-naloxone 8-2 mg Subl SL  tablet Commonly known as:  SUBOXONE     TAKE these medications     Indication  gabapentin 300 MG capsule Commonly known as:  NEURONTIN Take 1 capsule (300 mg total) by mouth 3 (three) times daily.  Indication:  Abuse or Misuse of Alcohol      Follow-up Information    [facility] Follow up on 02/26/2019.   Why:  Hospital discharge appointment is Friday, 5/22 at 2:30p.  Contact information: [address] [place] [id number] [phone]  Follow-up recommendations: Activity as tolerated. Diet as recommended by primary care physician. Keep all scheduled follow-up appointments as recommended.   Comments:   Patient is instructed to take all prescribed medications as recommended. Report any side effects or adverse reactions to your outpatient psychiatrist. Patient is instructed to abstain from alcohol and illegal drugs while on prescription medications. In the event of worsening symptoms, patient is instructed to call the crisis hotline, 911, or go to the nearest emergency department for evaluation and treatment.  Signed: Aldean Baker, NP 02/23/2019, 1:18 PM

## 2019-02-23 NOTE — [facility] Suicide Risk Assessment (Signed)
[facility] Discharge Suicide Risk Assessment   Principal Problem: Bipolar I disorder [facility]) Discharge Diagnoses: Principal Problem:   Bipolar I disorder (HCC) Active Problems:   Benzodiazepine abuse (HCC)   Total Time spent with patient: 45 minutes  Musculoskeletal: Strength & Muscle Tone: within normal limits Gait & Station: normal Patient leans: N/A  Psychiatric Specialty Exam: ROS  Blood pressure 100/62, pulse 97, temperature 98 F (36.7 C), temperature source Oral, resp. rate 16, height 6\' 2"  (1.88 m), weight 76.2 kg, SpO2 100 %.Body mass index is 21.57 kg/m.  General Appearance: Casual  Eye Contact::  good  Speech:  Clear and Coherent409  Volume:  Normal  Mood:  Euthymic  Affect:  Nl range  Thought Process:  Coherent  Orientation:  Full (Time, Place, and Person)  Thought Content:  Logical and Tangential  Suicidal Thoughts:  No  Homicidal Thoughts:  No  Memory:  Immediate;   Fair  Judgement:  Fair  Insight:  Fair  Psychomotor Activity:  Normal  Concentration:  Fair  Recall:  [employer] of Knowledge:Fair  Language: Good  Akathisia:  Negative  Handed:  Right  AIMS (if indicated):     Assets:  Communication Skills Desire for Improvement  Sleep:  Number of Hours: 6.75  Cognition: WNL  ADL's:  Intact   Mental Status Per Nursing Assessment::   On Admission:  NA  Demographic Factors:  Caucasian  Loss Factors: Decrease in vocational status  Historical Factors: Impulsivity  Risk Reduction Factors:   Sense of responsibility to family and Religious beliefs about death  Continued Clinical Symptoms:  Alcohol/Substance Abuse/Dependencies  Cognitive Features That Contribute To Risk:  None    Suicide Risk:  Minimal: No identifiable suicidal ideation.  Patients presenting with no risk factors but with morbid ruminations; may be classified as minimal risk based on the severity of the depressive symptoms  Follow-up Information    [facility] Follow up.    Why:  appt needed/walk in??  Contact information: [address] [place] [address] [phone]         Patient states he will live with his mother in [place] where there are no drugs he denies suicidal thoughts he is requesting Catapres but we decided to give him Neurontin upon discharge as that may be better for preventing relapse  Plan Of Care/Follow-up recommendations:  Activity:  Coletta Memos, MD 02/23/2019, 9:21 AM

## 2019-02-23 NOTE — Plan of Care (Signed)
Problem: Education: Goal: Knowledge of [facility] Education information/materials will improve Outcome: Adequate for Discharge Goal: Emotional status will improve Outcome: Adequate for Discharge Goal: Mental status will improve Outcome: Adequate for Discharge Goal: Verbalization of understanding the information provided will improve Outcome: Adequate for Discharge   Problem: Activity: Goal: Interest or engagement in activities will improve Outcome: Adequate for Discharge Goal: Sleeping patterns will improve Outcome: Adequate for Discharge   Problem: Coping: Goal: Ability to verbalize frustrations and anger appropriately will improve Outcome: Adequate for Discharge Goal: Ability to demonstrate self-control will improve Outcome: Adequate for Discharge   Problem: Health Behavior/Discharge Planning: Goal: Identification of resources available to assist in meeting health care needs will improve Outcome: Adequate for Discharge Goal: Compliance with treatment plan for underlying cause of condition will improve Outcome: Adequate for Discharge   Problem: Physical Regulation: Goal: Ability to maintain clinical measurements within normal limits will improve Outcome: Adequate for Discharge   Problem: Safety: Goal: Periods of time without injury will increase Outcome: Adequate for Discharge   Problem: Education: Goal: Ability to state activities that reduce stress will improve Outcome: Adequate for Discharge   Problem: Coping: Goal: Ability to identify and develop effective coping behavior will improve Outcome: Adequate for Discharge   Problem: Self-Concept: Goal: Ability to identify factors that promote anxiety will improve Outcome: Adequate for Discharge Goal: Level of anxiety will decrease Outcome: Adequate for Discharge Goal: Ability to modify response to factors that promote anxiety will improve Outcome: Adequate for Discharge   Problem: Education: Goal:  Utilization of techniques to improve thought processes will improve Outcome: Adequate for Discharge Goal: Knowledge of the prescribed therapeutic regimen will improve Outcome: Adequate for Discharge   Problem: Activity: Goal: Interest or engagement in leisure activities will improve Outcome: Adequate for Discharge Goal: Imbalance in normal sleep/wake cycle will improve Outcome: Adequate for Discharge   Problem: Coping: Goal: Coping ability will improve Outcome: Adequate for Discharge Goal: Will verbalize feelings Outcome: Adequate for Discharge   Problem: Health Behavior/Discharge Planning: Goal: Ability to make decisions will improve Outcome: Adequate for Discharge Goal: Compliance with therapeutic regimen will improve Outcome: Adequate for Discharge   Problem: Role Relationship: Goal: Will demonstrate positive changes in social behaviors and relationships Outcome: Adequate for Discharge   Problem: Safety: Goal: Ability to disclose and discuss suicidal ideas will improve Outcome: Adequate for Discharge Goal: Ability to identify and utilize support systems that promote safety will improve Outcome: Adequate for Discharge   Problem: Self-Concept: Goal: Will verbalize positive feelings about self Outcome: Adequate for Discharge Goal: Level of anxiety will decrease Outcome: Adequate for Discharge   Problem: Activity: Goal: Will identify at least one activity in which they can participate Outcome: Adequate for Discharge   Problem: Coping: Goal: Ability to identify and develop effective coping behavior will improve Outcome: Adequate for Discharge Goal: Ability to interact with others will improve Outcome: Adequate for Discharge Goal: Demonstration of participation in decision-making regarding own care will improve Outcome: Adequate for Discharge Goal: Ability to use eye contact when communicating with others will improve Outcome: Adequate for Discharge   Problem: Health  Behavior/Discharge Planning: Goal: Identification of resources available to assist in meeting health care needs will improve Outcome: Adequate for Discharge   Problem: Self-Concept: Goal: Will verbalize positive feelings about self Outcome: Adequate for Discharge   Problem: Education: Goal: Knowledge of disease or condition will improve Outcome: Adequate for Discharge Goal: Understanding of discharge needs will improve Outcome: Adequate for Discharge   Problem: Health  Behavior/Discharge Planning: Goal: Ability to identify changes in lifestyle to reduce recurrence of condition will improve Outcome: Adequate for Discharge Goal: Identification of resources available to assist in meeting health care needs will improve Outcome: Adequate for Discharge   Problem: Physical Regulation: Goal: Complications related to the disease process, condition or treatment will be avoided or minimized Outcome: Adequate for Discharge   Problem: Safety: Goal: Ability to remain free from injury will improve Outcome: Adequate for Discharge

## 2019-02-23 NOTE — Progress Notes (Signed)
Discharge note: Patient reviewed discharge paperwork with RN including prescriptions, follow up appointments, and lab work. Patient given the opportunity to ask questions. All concerns were addressed. All belongings were returned to patient. Denied SI/HI/AVH. Patient thanked staff for their care while at the hospital.  Patient was discharged to lobby where [employer] was waiting to pick him up.

## 2019-02-23 NOTE — Progress Notes (Signed)
Recreation Therapy Notes  Date:  1.22.20 Time: 0930 Location: [address] Dayroom  Group Topic: Stress Management  Goal Area(s) Addresses:  Patient will identify positive stress management techniques. Patient will identify benefits of using stress management post d/c.  Behavioral Response:  Engaged  Intervention: Stress Management  Activity :  Meditation.  LRT introduced the stress management technique of meditation.  LRT played a meditation that focused on making the most of your day.  Patients were to listen and follow as meditation played to engage in activity.  Education:  Stress Management, Discharge Planning.   Education Outcome: Acknowledges Education  Clinical Observations/Feedback: Pt attended and participated in group session.     Caroll Rancher, LRT/CTRS         Caroll Rancher A 02/23/2019 11:44 AM

## 2019-02-23 NOTE — Progress Notes (Signed)
  [facility] Adult Case Management Discharge Plan :  Will you be returning to the same living situation after discharge:  Yes,  at home with mother At discharge, do you have transportation home?: Yes,  sheriff's department is transporting him home Do you have the ability to pay for your medications: No.; no insurance  Release of information consent forms completed and in the chart;  Patient's signature needed at discharge.  Patient to Follow up at: Follow-up Information    [facility] Follow up.   Why:  Social Work Freight forwarder will contact you with your hospital discharge appointment.  Contact information: [address] [place] [address] [phone]           Next level of care provider has access to [facility] Link:no  Safety Planning and Suicide Prevention discussed: Yes,  with mother  Have you used any form of tobacco in the last 30 days? (Cigarettes, Smokeless Tobacco, Cigars, and/or Pipes): Yes  Has patient been referred to the Quitline?: Patient refused referral  Patient has been referred for addiction treatment: Yes  Delphia Grates, LCSW 02/23/2019, 11:14 AM

## 2019-03-05 ENCOUNTER — Other Ambulatory Visit: Payer: Self-pay

## 2019-03-05 ENCOUNTER — Encounter: Payer: Self-pay | Admitting: Emergency Medicine

## 2019-03-05 ENCOUNTER — Emergency Department
Admission: EM | Admit: 2019-03-05 | Discharge: 2019-03-06 | Disposition: A | Discharge status: Home / Self Care | Payer: Self-pay | Attending: Emergency Medicine | Admitting: Emergency Medicine

## 2019-03-05 DIAGNOSIS — R45851 Suicidal ideations: Secondary | ICD-10-CM | POA: Insufficient documentation

## 2019-03-05 DIAGNOSIS — F319 Bipolar disorder, unspecified: Secondary | ICD-10-CM | POA: Insufficient documentation

## 2019-03-05 DIAGNOSIS — F29 Unspecified psychosis not due to a substance or known physiological condition: Secondary | ICD-10-CM | POA: Diagnosis present

## 2019-03-05 DIAGNOSIS — F17210 Nicotine dependence, cigarettes, uncomplicated: Secondary | ICD-10-CM | POA: Insufficient documentation

## 2019-03-05 DIAGNOSIS — F1910 Other psychoactive substance abuse, uncomplicated: Secondary | ICD-10-CM | POA: Insufficient documentation

## 2019-03-05 LAB — URINE DRUG SCREEN, QUALITATIVE (ARMC ONLY)
Amphetamines, Ur Screen: NOT DETECTED
Barbiturates, Ur Screen: NOT DETECTED
Benzodiazepine, Ur Scrn: POSITIVE — AB
Cannabinoid 50 Ng, Ur ~~LOC~~: NOT DETECTED
Cocaine Metabolite,Ur ~~LOC~~: NOT DETECTED
MDMA (Ecstasy)Ur Screen: NOT DETECTED
Methadone Scn, Ur: NOT DETECTED
Opiate, Ur Screen: NOT DETECTED
Phencyclidine (PCP) Ur S: NOT DETECTED
Tricyclic, Ur Screen: NOT DETECTED

## 2019-03-05 LAB — COMPREHENSIVE METABOLIC PANEL
ALT: 14 U/L (ref 0–44)
AST: 19 U/L (ref 15–41)
Albumin: 4.4 g/dL (ref 3.5–5.0)
Alkaline Phosphatase: 87 U/L (ref 38–126)
Anion gap: 7 (ref 5–15)
BUN: 11 mg/dL (ref 6–20)
CO2: 30 mmol/L (ref 22–32)
Calcium: 9.2 mg/dL (ref 8.9–10.3)
Chloride: 106 mmol/L (ref 98–111)
Creatinine, Ser: 1 mg/dL (ref 0.61–1.24)
GFR calc Af Amer: >60 mL/min (ref >60)
GFR calc non Af Amer: >60 mL/min (ref >60)
Glucose, Bld: 109 mg/dL — ABNORMAL HIGH (ref 70–99)
Potassium: 4.7 mmol/L (ref 3.5–5.1)
Sodium: 143 mmol/L (ref 135–145)
Total Bilirubin: 0.6 mg/dL (ref 0.3–1.2)
Total Protein: 7.1 g/dL (ref 6.5–8.1)

## 2019-03-05 LAB — CBC
HCT: 41.9 % (ref 39.0–52.0)
Hemoglobin: 14.1 g/dL (ref 13.0–17.0)
MCH: 30.3 pg (ref 26.0–34.0)
MCHC: 33.7 g/dL (ref 30.0–36.0)
MCV: 90.1 fL (ref 80.0–100.0)
Platelets: 261 10*3/uL (ref 150–400)
RBC: 4.65 MIL/uL (ref 4.22–5.81)
RDW: 12.5 % (ref 11.5–15.5)
WBC: 6.9 10*3/uL (ref 4.0–10.5)
nRBC: 0 % (ref 0.0–0.2)

## 2019-03-05 LAB — ETHANOL: Alcohol, Ethyl (B): <10 mg/dL (ref <10)

## 2019-03-05 LAB — ACETAMINOPHEN LEVEL: Acetaminophen (Tylenol), Serum: <10 ug/mL — ABNORMAL LOW (ref 10–30)

## 2019-03-05 LAB — SALICYLATE LEVEL: Salicylate Lvl: <7 mg/dL (ref 2.8–30.0)

## 2019-03-05 MED ORDER — ZIPRASIDONE MESYLATE 20 MG IM SOLR: 20.0000 mg | Freq: Once | INTRAMUSCULAR | Status: DC

## 2019-03-05 MED ORDER — HALOPERIDOL LACTATE 5 MG/ML IJ SOLN
5.0000 mg | Freq: Once | INTRAMUSCULAR | Status: AC
  Administered 2019-03-05: 5 mg via INTRAMUSCULAR
  Filled 2019-03-05: qty 1

## 2019-03-05 MED ORDER — LORAZEPAM 2 MG/ML IJ SOLN
2.0000 mg | Freq: Once | INTRAMUSCULAR | Status: AC
  Administered 2019-03-05: 20:00:00 2 mg via INTRAMUSCULAR
  Filled 2019-03-05: qty 1

## 2019-03-05 NOTE — ED Triage Notes (Signed)
Patient arrived to ED in custody of [employer]. Forensic restraints in place. Patient yelling loudly but cooperating with staff upon arrival. Patient states that he was in an argument with his father and just got a little angry. Patient repeats, "I don't need to be here. I don't want to spend the night here. These cops showed up and I'm just trying to have a conversation with my dad. Patient denies SI/HI. Patient agrees to quite voice and cooperate with staff in order to have restraints removed.

## 2019-03-05 NOTE — ED Notes (Signed)
Patient cooperating with ED staff. Handcuffs removed by [employer]. Good color and circulation noted.

## 2019-03-05 NOTE — ED Notes (Signed)
Patient dressed out by this RN and EDT. Patient's belongings placed in one belonging bag and labeled. Navy shirt, [employer], black socks, black shoes, red underwear.
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
## 2019-03-06 DIAGNOSIS — F28 Other psychotic disorder not due to a substance or known physiological condition: Secondary | ICD-10-CM

## 2019-03-06 DIAGNOSIS — F29 Unspecified psychosis not due to a substance or known physiological condition: Secondary | ICD-10-CM | POA: Diagnosis present
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
## 2019-03-06 NOTE — ED Provider Notes (Signed)
-----------------------------------------   1:21 PM on 03/06/2019 -----------------------------------------   Blood pressure 135/80, pulse (!) 105, temperature 99.8 F (37.7 C), temperature source Oral, resp. rate (!) 22, height 6\' 3"  (1.905 m), weight 74.4 kg, SpO2 98 %.  Patient has been evaluated by psychiatry and cleared for discharge. IVC lifted by Gillermo Murdoch, NP. Patient's labs have been reviewed with no acute findings. Patient will be discharged at this time to home    Don Perking, [place], MD 03/06/19 1321

## 2019-03-06 NOTE — ED Notes (Signed)
Patient continues to walk out of his room and ask "when am I leaving".

## 2019-03-06 NOTE — BH Assessment (Signed)
Per the request of Psych Nurse Practitioner Celso Amy), writer spoke patient's mother [phone]). She states she is concerned about his drug use. When he use drugs, he urinates on himself and they have to watch him to make sure he doesn't get hurt. She voice no other concerns for his safety outside the results and behaviors when he impaired.  Writer spoke with patient and he denies SI/HI and AV/H.  Writer witnessed patient speak with his mother, to come upon discharge.

## 2019-03-06 NOTE — Discharge Instructions (Addendum)
You have been seen in the Emergency Department (ED)  today for a psychiatric complaint.  You have been evaluated by psychiatry and we believe you are safe to be discharged from the hospital.   ° °Please return to the Emergency Department (ED)  immediately if you have ANY thoughts of hurting yourself or anyone else, so that we may help you. ° °Please avoid alcohol and drug use. ° °Follow up with your doctor and/or therapist as soon as possible regarding today's ED  visit.  ° °You may call crisis hotline for [place] at [phone]. ° °

## 2019-03-06 NOTE — ED Notes (Signed)
Patient discharged home with parents, patient received discharge papers. Patient received belongings and verbalized he has received all of his belongings. Patient appropriate and cooperative, Denies SI/HI AVH. Vital signs taken. NAD noted.

## 2019-03-06 NOTE — ED Notes (Signed)
Hourly rounding reveals patient sleeping in room. No complaints, stable, in no acute distress. Q15 minute rounds and monitoring via Security to continue. 

## 2019-03-06 NOTE — ED Provider Notes (Signed)
-----------------------------------------   6:25 AM on 03/06/2019 -----------------------------------------   Blood pressure 135/80, pulse (!) 105, temperature 99.8 F (37.7 C), temperature source Oral, resp. rate (!) 22, height 6\' 3"  (1.905 m), weight 74.4 kg, SpO2 98 %.  The patient is calm and cooperative at this time.  There have been no acute events since the last update.  Awaiting disposition plan from Behavioral Medicine team.   Irean Hong, MD 03/06/19 [phone]
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
## 2019-09-29 ENCOUNTER — Other Ambulatory Visit: Payer: Self-pay

## 2019-09-29 ENCOUNTER — Emergency Department
Admission: EM | Admit: 2019-09-29 | Discharge: 2019-09-30 | Disposition: A | Discharge status: Home / Self Care | Payer: Self-pay | Attending: Student | Admitting: Student

## 2019-09-29 DIAGNOSIS — F1310 Sedative, hypnotic or anxiolytic abuse, uncomplicated: Secondary | ICD-10-CM | POA: Diagnosis present

## 2019-09-29 DIAGNOSIS — Z20828 Contact with and (suspected) exposure to other viral communicable diseases: Secondary | ICD-10-CM | POA: Insufficient documentation

## 2019-09-29 DIAGNOSIS — F1994 Other psychoactive substance use, unspecified with psychoactive substance-induced mood disorder: Secondary | ICD-10-CM | POA: Diagnosis present

## 2019-09-29 DIAGNOSIS — F15950 Other stimulant use, unspecified with stimulant-induced psychotic disorder with delusions: Secondary | ICD-10-CM | POA: Diagnosis present

## 2019-09-29 DIAGNOSIS — F319 Bipolar disorder, unspecified: Secondary | ICD-10-CM | POA: Diagnosis present

## 2019-09-29 DIAGNOSIS — F1910 Other psychoactive substance abuse, uncomplicated: Secondary | ICD-10-CM

## 2019-09-29 DIAGNOSIS — F29 Unspecified psychosis not due to a substance or known physiological condition: Secondary | ICD-10-CM | POA: Diagnosis present

## 2019-09-29 DIAGNOSIS — F17210 Nicotine dependence, cigarettes, uncomplicated: Secondary | ICD-10-CM | POA: Insufficient documentation

## 2019-09-29 DIAGNOSIS — F1510 Other stimulant abuse, uncomplicated: Secondary | ICD-10-CM | POA: Diagnosis present

## 2019-09-29 LAB — ETHANOL: Alcohol, Ethyl (B): <10 mg/dL (ref <10)

## 2019-09-29 LAB — COMPREHENSIVE METABOLIC PANEL
ALT: 32 U/L (ref 0–44)
AST: 49 U/L — ABNORMAL HIGH (ref 15–41)
Albumin: 5.1 g/dL — ABNORMAL HIGH (ref 3.5–5.0)
Alkaline Phosphatase: 65 U/L (ref 38–126)
Anion gap: 15 (ref 5–15)
BUN: 18 mg/dL (ref 6–20)
CO2: 27 mmol/L (ref 22–32)
Calcium: 9.8 mg/dL (ref 8.9–10.3)
Chloride: 96 mmol/L — ABNORMAL LOW (ref 98–111)
Creatinine, Ser: 1.01 mg/dL (ref 0.61–1.24)
GFR calc Af Amer: >60 mL/min (ref >60)
GFR calc non Af Amer: >60 mL/min (ref >60)
Glucose, Bld: 99 mg/dL (ref 70–99)
Potassium: 3.7 mmol/L (ref 3.5–5.1)
Sodium: 138 mmol/L (ref 135–145)
Total Bilirubin: 1 mg/dL (ref 0.3–1.2)
Total Protein: 8.3 g/dL — ABNORMAL HIGH (ref 6.5–8.1)

## 2019-09-29 LAB — RESPIRATORY PANEL BY RT PCR (FLU A&B, COVID)
Influenza A by PCR: NEGATIVE
Influenza B by PCR: NEGATIVE
SARS Coronavirus 2 by RT PCR: NEGATIVE

## 2019-09-29 LAB — CBC
HCT: 41.1 % (ref 39.0–52.0)
Hemoglobin: 15.3 g/dL (ref 13.0–17.0)
MCH: 30.6 pg (ref 26.0–34.0)
MCHC: 37.2 g/dL — ABNORMAL HIGH (ref 30.0–36.0)
MCV: 82.2 fL (ref 80.0–100.0)
Platelets: 232 10*3/uL (ref 150–400)
RBC: 5 MIL/uL (ref 4.22–5.81)
RDW: 12 % (ref 11.5–15.5)
WBC: 7.4 10*3/uL (ref 4.0–10.5)
nRBC: 0 % (ref 0.0–0.2)

## 2019-09-29 LAB — URINE DRUG SCREEN, QUALITATIVE (ARMC ONLY)
Amphetamines, Ur Screen: POSITIVE — AB
Barbiturates, Ur Screen: NOT DETECTED
Benzodiazepine, Ur Scrn: POSITIVE — AB
Cannabinoid 50 Ng, Ur ~~LOC~~: NOT DETECTED
Cocaine Metabolite,Ur ~~LOC~~: NOT DETECTED
MDMA (Ecstasy)Ur Screen: NOT DETECTED
Methadone Scn, Ur: NOT DETECTED
Opiate, Ur Screen: NOT DETECTED
Phencyclidine (PCP) Ur S: NOT DETECTED
Tricyclic, Ur Screen: NOT DETECTED

## 2019-09-29 LAB — ACETAMINOPHEN LEVEL: Acetaminophen (Tylenol), Serum: <10 ug/mL — ABNORMAL LOW (ref 10–30)

## 2019-09-29 LAB — SALICYLATE LEVEL: Salicylate Lvl: <7 mg/dL — ABNORMAL LOW (ref 7.0–30.0)

## 2019-09-29 MED ORDER — HALOPERIDOL LACTATE 5 MG/ML IJ SOLN
INTRAMUSCULAR | Status: AC
  Filled 2019-09-29: qty 1

## 2019-09-29 MED ORDER — OLANZAPINE 5 MG PO TBDP
5.0000 mg | ORAL_TABLET | Freq: Once | ORAL | Status: AC
  Administered 2019-09-29: 06:00:00 5 mg via ORAL
  Filled 2019-09-29: qty 1

## 2019-09-29 MED ORDER — HALOPERIDOL 5 MG PO TABS
5.0000 mg | ORAL_TABLET | Freq: Once | ORAL | Status: AC
  Administered 2019-09-29: 5 mg via ORAL
  Filled 2019-09-29: qty 1

## 2019-09-29 MED ORDER — DIPHENHYDRAMINE-ZINC ACETATE 2-0.1 % EX CREA: TOPICAL_CREAM | Freq: Once | CUTANEOUS | Status: DC

## 2019-09-29 MED ORDER — HYDROCORTISONE 1 % EX CREA
TOPICAL_CREAM | Freq: Once | CUTANEOUS | Status: DC
  Filled 2019-09-29: qty 28

## 2019-09-29 MED ORDER — LORAZEPAM 2 MG PO TABS
2.0000 mg | ORAL_TABLET | Freq: Once | ORAL | Status: AC
  Administered 2019-09-29: 09:00:00 2 mg via ORAL
  Filled 2019-09-29: qty 1

## 2019-09-29 MED ORDER — DIPHENHYDRAMINE HCL 25 MG PO CAPS
50.0000 mg | ORAL_CAPSULE | Freq: Once | ORAL | Status: AC
  Administered 2019-09-29: 09:00:00 50 mg via ORAL
  Filled 2019-09-29: qty 2

## 2019-09-29 MED ORDER — LORAZEPAM 2 MG/ML IJ SOLN
INTRAMUSCULAR | Status: AC
  Filled 2019-09-29: qty 1

## 2019-09-29 MED ORDER — LORAZEPAM 2 MG/ML IJ SOLN
2.0000 mg | Freq: Once | INTRAMUSCULAR | Status: AC
  Administered 2019-09-29: 10:00:00 2 mg via INTRAMUSCULAR

## 2019-09-29 MED ORDER — HALOPERIDOL LACTATE 5 MG/ML IJ SOLN
5.0000 mg | Freq: Once | INTRAMUSCULAR | Status: AC
  Administered 2019-09-29: 10:00:00 5 mg via INTRAMUSCULAR

## 2019-09-29 NOTE — ED Notes (Signed)
Pt continues to knock on windows and door, yelling to staff. Maintained on 15 minute checks and observation by security camera for safety.

## 2019-09-29 NOTE — ED Notes (Signed)
IVC/  PENDING  PLACEMENT 

## 2019-09-29 NOTE — ED Notes (Signed)
Hourly rounding reveals patient sitting in hall bed.  Pt complains about having to be in the hospital.  His condition is stable, in no acute distress. Q15 minute rounds and monitoring via Engineer, drilling to continue.

## 2019-09-29 NOTE — ED Notes (Signed)
Patient alert and oriented x4 Patient states he knows why he is here cause he was acting stupid. Patient denies SI/HI/AVH. This Probation officer advised patient I need urine and gave speciman cup for sample.

## 2019-09-29 NOTE — ED Notes (Signed)
Pt at nurse's station window. "I need someone now. This is a fucking emergency. I need a doctor now.. I have hundreds of parasites eating me."    Pt showing his leg to staff at the window.    Maintained on 15 minute checks and observation by security camera for safety.

## 2019-09-29 NOTE — ED Notes (Signed)
IVC/ Consult pending/ Moved to [facility]-5 

## 2019-09-29 NOTE — ED Notes (Signed)
Pt given PRN PO medications ordered by psychiatrist.

## 2019-09-29 NOTE — ED Notes (Signed)

## 2019-09-29 NOTE — Consult Note (Signed)
  Patient aggressive, agitated, banging on doors speaking in a paranoid manner.  Demanding to be transferred to [facility] for parasites on his skin.  Personally examined by writer, no parasites observed, skin lacerations or more typical of skin picking which would be in line with this patient's methamphetamine abuse.  Patient was given a dose of 5 mg Haldol 2 mg lorazepam and 50 mg Benadryl p.o. at approximately 9:10 AM.  After about 50 minutes patient continued to eat on glass be aggressive and represent a danger to himself.  IM medications of 5 mg of Haldol and 2 mg of Ativan were ordered and administered with good effect.  Patient observed approximately 25 minutes later to be sleeping calmly.Respiratory rate 18.

## 2019-09-29 NOTE — ED Provider Notes (Signed)
[facility] Emergency Department Provider Note  ____________________________________________   First MD Initiated Contact with Patient 09/29/19 0500     (approximate)  I have reviewed the triage vital signs and the nursing notes.  History  Chief Complaint No chief complaint on file.    HPI Brian Osborn is a 28 y.o. male with history of bipolar disorder, psychosis, substance abuse who presents to the emergency department under IVC.  Per IVC paperwork "respondent is addicted to numerous drugs.  Tonight he is hallucinating.thinks that he was shot by buckshot and is using a butcher knife to cut the pellets out of his skin."  On arrival, patient does admit to benzodiazapine use. He states he "self prescribes himself."   Otherwise, he is quite tangential and nonsensical, talking about how to "make molecules and various medications".    Past Medical Hx Past Medical History:  Diagnosis Date  . History of drug abuse [facility])     Problem List Patient Active Problem List   Diagnosis Date Noted  . Psychosis ([place]) 03/06/2019  . Bipolar I disorder ([place]) 02/20/2019  . 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 Substance induced mood disorder ([place]) 01/13/2018  . Benzodiazepine abuse ([place]) 01/13/2018    Past Surgical Hx History reviewed. No pertinent surgical history.  Medications Prior to Admission medications   Medication Sig Start Date End Date Taking? Authorizing Provider  gabapentin (NEURONTIN) 300 MG capsule Take 1 capsule (300 mg total) by mouth 3 (three) times daily. 02/23/19   Johnn Hai, MD    Allergies Bee venom  Family Hx No family history on file.  Social Hx Social History   Tobacco Use  . Smoking status: Current Every Day Smoker    Packs/day: 1.00    Types: Cigarettes  . Smokeless tobacco: Never Used  Substance  Use Topics  . Alcohol use: Yes    Comment: socially  . Drug use: Yes    Types: Methamphetamines    Comment: snorted today     Review of Systems  Constitutional: Negative for fever, chills. Eyes: Negative for visual changes. ENT: Negative for sore throat. Cardiovascular: Negative for chest pain. Respiratory: Negative for shortness of breath. Gastrointestinal: Negative for nausea, vomiting.  Genitourinary: Negative for dysuria. Musculoskeletal: Negative for leg swelling. Skin: Negative for rash. Neurological: Negative for for headaches.   Physical Exam  Vital Signs: ED Triage Vitals  Enc Vitals Group     BP 09/29/19 0443 135/88     Pulse Rate 09/29/19 0443 94     Resp 09/29/19 0443 18     Temp 09/29/19 0448 97.6 F (36.4 C)     Temp Source 09/29/19 0448 Oral     SpO2 09/29/19 0443 99 %     Weight 09/29/19 0440 180 lb (81.6 kg)     Height 09/29/19 0440 6\' 2"  (1.88 m)     Head Circumference --      Peak Flow --      Pain Score 09/29/19 0440 0     Pain Loc --      Pain Edu? --      Excl. in [place]? --     Constitutional: Alert and oriented.  Somewhat disheveled. Head: Normocephalic. Atraumatic. Eyes: Conjunctivae clear. Sclera anicteric. Nose: No congestion. No rhinorrhea. Mouth/Throat: Mucous membranes are moist.  Neck: No stridor.   Cardiovascular: Normal rate. Extremities well perfused. Respiratory: Normal respiratory effort.  Gastrointestinal: Non-distended.  Musculoskeletal: No deformities. Neurologic:  No gross focal neurologic deficits are appreciated.  Skin: Skin is warm, dry and intact. No laceration, abrasions. Psychiatric: Tangential, nonsensical, rambling  EKG  N/A    Radiology  N/A   Procedures  Procedure(s) performed (including critical care):  Procedures   Initial Impression / Assessment and Plan / ED Course  28 y.o. male who presents to the ED under IVC for substance abuse, hallucinations, psychosis.  Suspect patient's  presentation is related to their known psychiatric diagnosis.  Likely worsened by substance abuse.  Will obtain basic screening labs and consult psychiatry and TTS.     Final Clinical Impression(s) / ED Diagnosis  Final diagnoses:  Involuntary commitment       Note:  This document was prepared using Dragon voice recognition software and may include unintentional dictation errors.   Miguel Aschoff., MD 09/29/19 [phone]

## 2019-09-29 NOTE — ED Notes (Signed)
Pt given sandwich tray 

## 2019-09-29 NOTE — ED Notes (Signed)
Pt had been told by security and RN he must wear clothing when leaving his room. Pt continuing to walk around the unit with a blanket and no clothing. Pt moved to [place] [facility] due to other patients on the unit.

## 2019-09-29 NOTE — BH Assessment (Signed)
TTS attempted to assess pt. Patient aggressive, agitated, banging on doors speaking in a paranoid manner.  Pt unable to participate in assessment due to receiving IM injection for sedation and safety.  Will attempt at a later time when pt is cooperative and calm.

## 2019-09-29 NOTE — ED Notes (Signed)
1 Buprenorphine and Naloxone 8mg /2mg  SL film from patient's belongings given to Pharmacy.

## 2019-09-29 NOTE — ED Triage Notes (Signed)
Patient coming in under IVC for possible intentional overdose. Patient found on bed "barely breathing" per [employer]. When patient was aroused he became aggressive when he believed officers were going to take his narcotics. Patient has hx of meth use per father.

## 2019-09-29 NOTE — ED Notes (Signed)
Urine sent to lab 

## 2019-09-29 NOTE — ED Notes (Signed)
Patient changed into hospital provided scrubs. Patient's belonging's placed into labeled bag.   Patient's belonging's include: Pair of tennis shoes, pair white socks, black wallet, t-shirt, black belt, khaki shorts. Buprenorphine and Naloxone 8mg /2mg  SL film (1 empty pack, 1 unopened pack).

## 2019-09-29 NOTE — ED Notes (Signed)
Patient asleep in room. No noted distress or abnormal behavior. Will continue 15 minute checks and observation by security cameras for safety. 

## 2019-09-29 NOTE — ED Notes (Signed)
Security noticed the patient was bend down beside the bed and it appeared that he was taking the batteries out of the remote. We approached the patient and asked for the remote, he was trying to hide it under his arm. When he handed it to be it was the back plastic cover and the rest of the remote was hid under his mattress. All parts are accounted for. The patient then became very agitated because of this. He started hitting the walls and trying to pick up the chair and throw it at the door. [facility] nurse notified of the incident.

## 2019-09-29 NOTE — ED Notes (Signed)
Pt continually asking for Xanax. "I need at least 2 mg." .

## 2019-09-29 NOTE — ED Notes (Signed)
Pt awake and given dinner tray. Pt compliant with Covid swab.  RN explained to patient the NP would be in tonight to assess him and make a plan. Pt cooperative at this time.

## 2019-09-29 NOTE — ED Notes (Signed)
Pt behaviors escalating. PRN injections ordered by psychiatrist. Pt allowed RN to give injection with security officers present.

## 2019-09-29 NOTE — ED Notes (Signed)
Pt. Transferred from Triage to room after dressing out and screening for contraband. Report to include Situation, Background, Assessment and Recommendations from RN. Pt. Oriented to [place] including Q15 minute rounds as well as Rover and Officer for their protection. Patient is alert and oriented, warm and dry in no acute distress. Patient denies SI, HI, and AVH. Pt. Encouraged to let me know if needs arise.   

## 2019-09-30 DIAGNOSIS — F1994 Other psychoactive substance use, unspecified with psychoactive substance-induced mood disorder: Secondary | ICD-10-CM

## 2019-09-30 NOTE — ED Provider Notes (Signed)
-----------------------------------------   4:34 AM on 09/30/2019 -----------------------------------------   Blood pressure 135/88, pulse 94, temperature 97.6 F (36.4 C), temperature source Oral, resp. rate 18, height 6\' 2"  (1.88 m), weight 81.6 kg, SpO2 99 %.  The patient is calm and cooperative at this time.  There have been no acute events since the last update.  Awaiting disposition plan from Behavioral Medicine and/or Social Work team(s).   Arta Silence, MD 09/30/19 [phone]

## 2019-09-30 NOTE — Consult Note (Addendum)
[facility] Face-to-Face Psychiatry Consult   Reason for Consult: Hallucinations Referring Physician: Dr. Colon Branch Patient Identification: Brian Osborn MRN:  [id number] Principal Diagnosis: Substance induced mood disorder (HCC) Diagnosis:  Principal Problem:   Substance induced mood disorder (HCC) Active Problems:   Benzodiazepine abuse (HCC)   Amphetamine and psychostimulant-induced psychosis with delusions (HCC)   Amphetamine abuse (HCC)   Methamphetamine abuse (HCC)   Bipolar I disorder (HCC)   Psychosis (HCC)   Total Time spent with patient: 30 minutes  Subjective: "I used some drugs and he had me hallucinating like sh#@" Brian Osborn is a 28 y.o. male patient presented to [facility] ED via law enforcement and under involuntary commitment status (IVC). Per the ED triage nursing note, the patient was brought in with a possible intentional overdose.  The patient denies ever trying to hurt himself.  He states, "I use a little bit more than my body could not handle." The patient was seen face-to-face by this provider; chart reviewed and consulted with Dr. Erma Heritage on 09/29/2019 due to the patient's care. It was discussed with the EDP that the patient would be observed overnight and reassess in the a.m. to determine if he meets criteria for psychiatric inpatient admission or can be discharged back into the community. The patient is alert and oriented x 2 calm, cooperative, and mood-congruent with affect on evaluation.   The patient voice, "I am a little sleepy from the medication I got today."  The patient stated, "there was never a time I try to hurt myself intentionally."  He said, "I was using, and I use a little bit more than my body could handle." The patient does not appear to be responding to internal or external stimuli. Neither is the patient presenting with any delusional thinking. The patient denies auditory or visual hallucinations. The patient denies suicidal, homicidal, or self-harm ideations. The  patient is not presenting with any psychotic or paranoid behaviors. During an encounter with the patient, he/she was able to answer questions appropriately.  Plan: The patient will be observed overnight and reassess in the a.m. to determine if he meets criteria for psychiatric inpatient admission or could be discharged back into the community. HPI: Per Dr. Colon Branch; Brian Osborn is a 28 y.o. male with history of bipolar disorder, psychosis, substance abuse who presents to the emergency department under IVC.  Per IVC paperwork "respondent is addicted to numerous drugs.  Tonight he is hallucinating.thinks that he was shot by buckshot and is using a butcher knife to cut the pellets out of his skin."  On arrival, patient does admit to benzodiazapine use. He states he "self prescribes himself."   Otherwise, he is quite tangential and nonsensical, talking about how to "make molecules and various medications".  Past Psychiatric History:  Polysubstance drug abuse (HCC)  Risk to Self: Suicidal Ideation: No Suicidal Intent: No Is patient at risk for suicide?: No Suicidal Plan?: No Access to Means: No What has been your use of drugs/alcohol within the last 12 months?: Methampthetamine Use Triggers for Past Attempts: None known Intentional Self Injurious Behavior: None Risk to Others: Homicidal Ideation: No Thoughts of Harm to Others: No Current Homicidal Intent: No Current Homicidal Plan: No Access to Homicidal Means: No History of harm to others?: No Assessment of Violence: None Noted Violent Behavior Description: Patient has a history of becoming violent when under the influence Does patient have access to weapons?: No Criminal Charges Pending?: No Does patient have a court date: No Prior Inpatient  Therapy: Prior Inpatient Therapy: No Prior Outpatient Therapy: Prior Outpatient Therapy: No Does patient have an ACCT team?: No Does patient have Intensive In-House Services?  : No Does patient  have [facility] services? : No Does patient have P4CC services?: No  Past Medical History:  Past Medical History:  Diagnosis Date  . History of drug abuse ([place])    History reviewed. No pertinent surgical history. Family History: No family history on file. Family Psychiatric  History:  Social History:  Social History   Substance and Sexual Activity  Alcohol Use Yes   Comment: socially     Social History   Substance and Sexual Activity  Drug Use Yes  . Types: Methamphetamines   Comment: snorted today    Social History   Socioeconomic History  . Marital status: Single    Spouse name: Not on file  . Number of children: Not on file  . Years of education: Not on file  . Highest education level: Not on file  Occupational History  . Not on file  Tobacco Use  . Smoking status: Current Every Day Smoker    Packs/day: 1.00    Types: Cigarettes  . Smokeless tobacco: Never Used  Substance and Sexual Activity  . Alcohol use: Yes    Comment: socially  . Drug use: Yes    Types: Methamphetamines    Comment: snorted today  . Sexual activity: Yes    Birth control/protection: None  Other Topics Concern  . Not on file  Social History Narrative  . Not on file   Social Determinants of Health   Financial Resource Strain:   . Difficulty of Paying Living Expenses: Not on file  Food Insecurity:   . Worried About Charity fundraiser in the Last Year: Not on file  . Ran Out of Food in the Last Year: Not on file  Transportation Needs:   . Lack of Transportation (Medical): Not on file  . Lack of Transportation (Non-Medical): Not on file  Physical Activity:   . Days of Exercise per Week: Not on file  . Minutes of Exercise per Session: Not on file  Stress:   . Feeling of Stress : Not on file  Social Connections:   . Frequency of Communication with Friends and Family: Not on file  . Frequency of Social Gatherings with Friends and Family: Not on file  . Attends Religious Services: Not  on file  . Active Member of Clubs or Organizations: Not on file  . Attends Archivist Meetings: Not on file  . Marital Status: Not on file   Additional Social History:    Allergies:   Allergies  Allergen Reactions  . Bee Venom Hives    Labs:  Results for orders placed or performed during the hospital encounter of 09/29/19 (from the past 48 hour(s))  Comprehensive metabolic panel     Status: Abnormal   Collection Time: 09/29/19  4:45 AM  Result Value Ref Range   Sodium 138 135 - 145 mmol/L   Potassium 3.7 3.5 - 5.1 mmol/L   Chloride 96 (L) 98 - 111 mmol/L   CO2 27 22 - 32 mmol/L   Glucose, Bld 99 70 - 99 mg/dL   BUN 18 6 - 20 mg/dL   Creatinine, Ser 1.01 0.61 - 1.24 mg/dL   Calcium 9.8 8.9 - 10.3 mg/dL   Total Protein 8.3 (H) 6.5 - 8.1 g/dL   Albumin 5.1 (H) 3.5 - 5.0 g/dL   AST 49 (H)  15 - 41 U/L   ALT 32 0 - 44 U/L   Alkaline Phosphatase 65 38 - 126 U/L   Total Bilirubin 1.0 0.3 - 1.2 mg/dL   GFR calc non Af Amer >60 >60 mL/min   GFR calc Af Amer >60 >60 mL/min   Anion gap 15 5 - 15    Comment: Performed at [facility], [address] [place], [place] [address]  Ethanol     Status: None   Collection Time: 09/29/19  4:45 AM  Result Value Ref Range   Alcohol, Ethyl (B) <10 <10 mg/dL    Comment: (NOTE) Lowest detectable limit for serum alcohol is 10 mg/dL. For medical purposes only. Performed at [facility], [address] [place], [place] [address]   Salicylate level     Status: Abnormal   Collection Time: 09/29/19  4:45 AM  Result Value Ref Range   Salicylate Lvl <7.0 (L) 7.0 - 30.0 mg/dL    Comment: Performed at [facility], [address] [place], [place] [address]  Acetaminophen level     Status: Abnormal   Collection Time: 09/29/19  4:45 AM  Result Value Ref Range   Acetaminophen (Tylenol), Serum <10 (L) 10 - 30 ug/mL    Comment: (NOTE) Therapeutic concentrations vary significantly. A range of 10-30 ug/mL  may  be an effective concentration for many patients. However, some  are best treated at concentrations outside of this range. Acetaminophen concentrations >150 ug/mL at 4 hours after ingestion  and >50 ug/mL at 12 hours after ingestion are often associated with  toxic reactions. Performed at [facility], [address] [place], [place] [address]   cbc     Status: Abnormal   Collection Time: 09/29/19  4:45 AM  Result Value Ref Range   WBC 7.4 4.0 - 10.5 K/uL   RBC 5.00 4.22 - 5.81 MIL/uL   Hemoglobin 15.3 13.0 - 17.0 g/dL   HCT 29.5 62.1 - 30.8 %   MCV 82.2 80.0 - 100.0 fL   MCH 30.6 26.0 - 34.0 pg   MCHC 37.2 (H) 30.0 - 36.0 g/dL    Comment: CORRECTED FOR COLD AGGLUTININS   RDW 12.0 11.5 - 15.5 %   Platelets 232 150 - 400 K/uL   nRBC 0.0 0.0 - 0.2 %    Comment: Performed at [facility], [address], [place], [place] [address]  Urine Drug Screen, Qualitative     Status: Abnormal   Collection Time: 09/29/19  7:26 AM  Result Value Ref Range   Tricyclic, Ur Screen NONE DETECTED NONE DETECTED   Amphetamines, Ur Screen POSITIVE (A) NONE DETECTED   MDMA (Ecstasy)Ur Screen NONE DETECTED NONE DETECTED   Cocaine Metabolite,Ur [place] NONE DETECTED NONE DETECTED   Opiate, Ur Screen NONE DETECTED NONE DETECTED   Phencyclidine (PCP) Ur S NONE DETECTED NONE DETECTED   Cannabinoid 50 Ng, Ur [place] NONE DETECTED NONE DETECTED   Barbiturates, Ur Screen NONE DETECTED NONE DETECTED   Benzodiazepine, Ur Scrn POSITIVE (A) NONE DETECTED   Methadone Scn, Ur NONE DETECTED NONE DETECTED    Comment: (NOTE) Tricyclics + metabolites, urine    Cutoff 1000 ng/mL Amphetamines + metabolites, urine  Cutoff 1000 ng/mL MDMA (Ecstasy), urine              Cutoff 500 ng/mL Cocaine Metabolite, urine          Cutoff 300 ng/mL Opiate + metabolites, urine        Cutoff  300 ng/mL Phencyclidine (PCP), urine         Cutoff 25 ng/mL Cannabinoid, urine                 Cutoff 50 ng/mL Barbiturates +  metabolites, urine  Cutoff 200 ng/mL Benzodiazepine, urine              Cutoff 200 ng/mL Methadone, urine                   Cutoff 300 ng/mL The urine drug screen provides only a preliminary, unconfirmed analytical test result and should not be used for non-medical purposes. Clinical consideration and professional judgment should be applied to any positive drug screen result due to possible interfering substances. A more specific alternate chemical method must be used in order to obtain a confirmed analytical result. Gas chromatography / mass spectrometry (GC/MS) is the preferred confirmat ory method. Performed at [facility], [address] [place], [place] [address]   Respiratory Panel by RT PCR (Flu A&B, Covid) - Nasopharyngeal Swab     Status: None   Collection Time: 09/29/19  6:31 PM   Specimen: Nasopharyngeal Swab  Result Value Ref Range   SARS Coronavirus 2 by RT PCR NEGATIVE NEGATIVE    Comment: (NOTE) SARS-CoV-2 target nucleic acids are NOT DETECTED. The SARS-CoV-2 RNA is generally detectable in upper respiratoy specimens during the acute phase of infection. The lowest concentration of SARS-CoV-2 viral copies this assay can detect is 131 copies/mL. A negative result does not preclude SARS-Cov-2 infection and should not be used as the sole basis for treatment or other patient management decisions. A negative result may occur with  improper specimen collection/handling, submission of specimen other than nasopharyngeal swab, presence of viral mutation(s) within the areas targeted by this assay, and inadequate number of viral copies (<131 copies/mL). A negative result must be combined with clinical observations, patient history, and epidemiological information. The expected result is Negative. Fact Sheet for Patients:  https://www.moore.com/https://www.fda.gov/media/142436/download Fact Sheet for Healthcare Providers:  https://www.young.biz/https://www.fda.gov/media/142435/download This test is not yet ap  proved or cleared by the [place] FDA and  has been authorized for detection and/or diagnosis of SARS-CoV-2 by FDA under an Emergency Use Authorization (EUA). This EUA will remain  in effect (meaning this test can be used) for the duration of the COVID-19 declaration under Section 564(b)(1) of the Act, 21 U.S.C. section 360bbb-3(b)(1), unless the authorization is terminated or revoked sooner.    Influenza A by PCR NEGATIVE NEGATIVE   Influenza B by PCR NEGATIVE NEGATIVE    Comment: (NOTE) The Xpert Xpress SARS-CoV-2/FLU/RSV assay is intended as an aid in  the diagnosis of influenza from Nasopharyngeal swab specimens and  should not be used as a sole basis for treatment. Nasal washings and  aspirates are unacceptable for Xpert Xpress SARS-CoV-2/FLU/RSV  testing. Fact Sheet for Patients: https://www.moore.com/https://www.fda.gov/media/142436/download Fact Sheet for Healthcare Providers: https://www.young.biz/https://www.fda.gov/media/142435/download This test is not yet approved or cleared by the [place] FDA and  has been authorized for detection and/or diagnosis of SARS-CoV-2 by  FDA under an Emergency Use Authorization (EUA). This EUA will remain  in effect (meaning this test can be used) for the duration of the  Covid-19 declaration under Section 564(b)(1) of the Act, 21  U.S.C. section 360bbb-3(b)(1), unless the authorization is  terminated or revoked. Performed at [facility], [address] [place], [place] [address]     Current Facility-Administered Medications  Medication Dose Route Frequency Provider Last Rate Last Admin  .  hydrocortisone cream 1 %   Topical Once Cristofano, Worthy Rancher, MD       Current Outpatient Medications  Medication Sig Dispense Refill  . gabapentin (NEURONTIN) 300 MG capsule Take 1 capsule (300 mg total) by mouth 3 (three) times daily. (Patient not taking: Reported on 09/29/2019) 90 capsule 2    Musculoskeletal: Strength & Muscle Tone: within normal limits Gait &  Station: normal Patient leans: N/A  Psychiatric Specialty Exam: Physical Exam  Nursing note and vitals reviewed. Constitutional: He is oriented to person, place, and time. He appears well-developed.  Cardiovascular: Normal rate.  Respiratory: Effort normal.  Musculoskeletal:        General: Normal range of motion.     Cervical back: Normal range of motion and neck supple.  Neurological: He is alert and oriented to person, place, and time.  Psychiatric: He has a normal mood and affect.    Review of Systems  Psychiatric/Behavioral: The patient is nervous/anxious.   All other systems reviewed and are negative.   Blood pressure 135/88, pulse 94, temperature 97.6 F (36.4 C), temperature source Oral, resp. rate 18, height 6\' 2"  (1.88 m), weight 81.6 kg, SpO2 99 %.Body mass index is 23.11 kg/m.  General Appearance: Casual  Eye Contact:  Fair  Speech:  Slow  Volume:  Decreased  Mood:  Depressed  Affect:  Congruent  Thought Process:  Coherent  Orientation:  Full (Time, Place, and Person)  Thought Content:  WDL and Logical  Suicidal Thoughts:  No  Homicidal Thoughts:  No  Memory:  Immediate;   Good Recent;   Good Remote;   Good  Judgement:  Fair  Insight:  Fair  Psychomotor Activity:  Normal  Concentration:  Concentration: Fair and Attention Span: Fair  Recall:  of Knowledge:  Fair  Language:  Good  Akathisia:  Negative  Handed:  Right  AIMS (if indicated):     Assets:  Communication Skills Desire for Improvement Resilience Social Support  ADL's:  Intact  Cognition:  WNL  Sleep:   Good     Treatment Plan Summary: Daily contact with patient to assess and evaluate symptoms and progress in treatment and Plan The patient will be observed overnight and reassess in the a.m. to determine if he meets criteria for psychiatric inpatient admission or can be discharged back into the community.  Disposition: No evidence of imminent risk to self or others at present.    Supportive therapy provided about ongoing stressors. The patient will be observed overnight and reassess in the a.m. to determine if he meets criteria for psychiatric inpatient admission or can be discharged back to the community.  [employer], NP 09/30/2019 1:33 AM

## 2019-09-30 NOTE — Care Management (Signed)
RN CM: Notified patient is in need of a jacket and pair of pants. Will retrieve from clothing closet and deliver to patient prior to discharge.

## 2019-09-30 NOTE — BH Assessment (Signed)
Writer spoke with the patient to complete updated/reassessment. Patient acknowledges his substance use is a problem and reports of having plans to follow up with outpatient treatment. He denies SI/HI and AV/H.

## 2019-09-30 NOTE — ED Provider Notes (Signed)
Cleared for discharge by psychiatry   Raeonna Milo, MD 09/30/19 1105  

## 2019-09-30 NOTE — BH Assessment (Signed)
Assessment Note  Brian Osborn is an 28 y.o. male presenting to [facility] ED via [facility] department under IVC. Per triage note when patient arrived on 12/22 this morning patient was brought in for possible overdose. Patient was found on bed "barely breathing" per [employer]. When patient became aroused he became aggressive and believed that officers were going to take his narcotics. Per patient's father patient has a history of methamphetamine use. During the assessment patient had difficulty being alert as he had been sedated by staff to assist with his aggressive behavior. Patient had difficulty answering questions and his speech was slurred at times, patient had to be prompted and encouraged to ask questions by psychiatric team. Patient reports that his overdose was not intentional "I was using substances and being stupid." Patient reports "I wasn't using to hurt myself." Patient reports that he got upset that a friend tried to steal from him "my buddy tried to steal from me." Patient was able to understand that he became aggressive with staff earlier "I got a little angry today" and reports that he was delusional at the time due to the substances. Patient reports a history of being sober in the past "for a year and a half" but denies any current outpatient treatment. Patient reports that he was seeing a psychiatrist "5-6 months" ago but could not recall the facility he was attending. Patient reports he currently lives with his father and is currently seeking work. UDS was positive for Amphetamines and Benzodiazepines. Patient denies current SI/HI/AH/VH.   Per Psyc NP patient will be observed overnight and reassessed in the morning due to patient being too sedated.  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 History of drug abuse (HCC)     History reviewed. No pertinent surgical history.  Family History: No family  history on file.  Social History:  reports that he has been smoking cigarettes. He has been smoking about 1.00 pack per day. He has never used smokeless tobacco. He reports current alcohol use. He reports current drug use. Drug: Methamphetamines.  Additional Social History:  Alcohol / Drug Use Pain Medications: See MAR Prescriptions: See MAR Over the Counter: See MAR History of alcohol / drug use?: Yes Substance #1 Name of Substance 1: Methamphetamine  CIWA: CIWA-Ar BP: 135/88 Pulse Rate: 94 COWS:    Allergies:  Allergies  Allergen Reactions  . Bee Venom Hives    Home Medications: (Not in a hospital admission)   OB/GYN Status:  No LMP for male patient.  General Assessment Data Location of Assessment: [facility] ED TTS Assessment: In system Is this a Tele or Face-to-Face Assessment?: Face-to-Face Is this an Initial Assessment or a Re-assessment for this encounter?: Initial Assessment Patient Accompanied by:: N/A Language Other than English: No Living Arrangements: Other (Comment)(Private Residence with father) What gender do you identify as?: Male Marital status: Single Living Arrangements: Parent Can pt return to current living arrangement?: Yes Admission Status: Involuntary Petitioner: Police Is patient capable of signing voluntary admission?: No Referral Source: Other Insurance type: None  Medical Screening Exam [facility] Walk-in ONLY) Medical Exam completed: Yes  Crisis Care Plan Living Arrangements: Parent Legal Guardian: Other:(Patient is his own legal guardian)  Education Status Is patient currently in school?: No Is the patient employed, unemployed or receiving disability?: Unemployed  Risk to self with the past 6 months Suicidal Ideation: No Has patient been a risk to self within the past 6 months  prior to admission? : No Suicidal Intent: No Has patient had any suicidal intent within the past 6 months prior to admission? : No Is patient at risk for suicide?:  No Suicidal Plan?: No Has patient had any suicidal plan within the past 6 months prior to admission? : No Access to Means: No What has been your use of drugs/alcohol within the last 12 months?: Methampthetamine Use Previous Attempts/Gestures: No Triggers for Past Attempts: None known Intentional Self Injurious Behavior: None Family Suicide History: No Recent stressful life event(s): Other (Comment)(None reported) Persecutory voices/beliefs?: No Depression: Yes Depression Symptoms: Loss of interest in usual pleasures Substance abuse history and/or treatment for substance abuse?: Yes Suicide prevention information given to non-admitted patients: Not applicable  Risk to Others within the past 6 months Homicidal Ideation: No Does patient have any lifetime risk of violence toward others beyond the six months prior to admission? : No Thoughts of Harm to Others: No Current Homicidal Intent: No Current Homicidal Plan: No Access to Homicidal Means: No History of harm to others?: No Assessment of Violence: None Noted Violent Behavior Description: Patient has a history of becoming violent when under the influence Does patient have access to weapons?: No Criminal Charges Pending?: No Does patient have a court date: No Is patient on probation?: No  Psychosis Hallucinations: None noted Delusions: None noted  Mental Status Report Appearance/Hygiene: In scrubs Eye Contact: Poor Motor Activity: Freedom of movement Speech: Slurred Level of Consciousness: Sedated Mood: Irritable Affect: Flat Anxiety Level: Minimal Thought Processes: Thought Blocking Judgement: Impaired Orientation: Person, Place, Time, Situation, Appropriate for developmental age Obsessive Compulsive Thoughts/Behaviors: None  Cognitive Functioning Concentration: Fair Memory: Recent Intact, Remote Impaired Is patient IDD: No Insight: Poor Impulse Control: Poor Appetite: Good Have you had any weight changes? : No  Change Sleep: No Change Total Hours of Sleep: 6 Vegetative Symptoms: None  ADLScreening [facility] Assessment Services) Patient's cognitive ability adequate to safely complete daily activities?: Yes Patient able to express need for assistance with ADLs?: Yes Independently performs ADLs?: Yes (appropriate for developmental age)  Prior Inpatient Therapy Prior Inpatient Therapy: No  Prior Outpatient Therapy Prior Outpatient Therapy: No Does patient have an ACCT team?: No Does patient have Intensive In-House Services?  : No Does patient have [facility] services? : No Does patient have P4CC services?: No  ADL Screening (condition at time of admission) Patient's cognitive ability adequate to safely complete daily activities?: Yes Is the patient deaf or have difficulty hearing?: No Does the patient have difficulty seeing, even when wearing glasses/contacts?: No Does the patient have difficulty concentrating, remembering, or making decisions?: No Patient able to express need for assistance with ADLs?: Yes Does the patient have difficulty dressing or bathing?: No Independently performs ADLs?: Yes (appropriate for developmental age) Does the patient have difficulty walking or climbing stairs?: No Weakness of Legs: None Weakness of Arms/Hands: None  Home Assistive Devices/Equipment Home Assistive Devices/Equipment: None  Therapy Consults (therapy consults require a physician order) PT Evaluation Needed: No OT Evalulation Needed: No SLP Evaluation Needed: No Abuse/Neglect Assessment (Assessment to be complete while patient is alone) Abuse/Neglect Assessment Can Be Completed: Yes Physical Abuse: Denies Verbal Abuse: Denies Sexual Abuse: Denies Exploitation of patient/patient's resources: Denies Self-Neglect: Denies Values / Beliefs Cultural Requests During Hospitalization: None Spiritual Requests During Hospitalization: None Consults Spiritual Care Consult Needed: No Transition of Care  Team Consult Needed: No Advance Directives (For Healthcare) Does Patient Have a Medical Advance Directive?: No Would patient like information on creating a medical  advance directive?: No - Patient declined       Child/Adolescent Assessment Running Away Risk: Denies(Pt is an adult)  Disposition: Per Psyc NP patient will be observed overnight and reassessed in the morning due to patient being too sedated. Disposition Initial Assessment Completed for this Encounter: Yes  On Site Evaluation by:   Reviewed with Physician:    Benay PikeJamila A Selma Mink MS LCAS-A 09/30/2019 12:25 AM

## 2019-09-30 NOTE — ED Notes (Signed)
Pt showering at present. NA present in the day room for safety.

## 2019-09-30 NOTE — ED Notes (Signed)
Spoke with Mr. Navarro Nine patients father, aware tha patient is being discharged from unit. As per father he is at work not able to pick him up right now, requested that Bradd call one of his friends and see if they can give him a ride back home.

## 2019-11-03 ENCOUNTER — Encounter: Payer: Self-pay | Admitting: Emergency Medicine

## 2019-11-03 ENCOUNTER — Emergency Department
Admission: EM | Admit: 2019-11-03 | Discharge: 2019-11-03 | Disposition: A | Discharge status: Home / Self Care | Payer: Self-pay | Attending: Emergency Medicine | Admitting: Emergency Medicine

## 2019-11-03 DIAGNOSIS — F1310 Sedative, hypnotic or anxiolytic abuse, uncomplicated: Secondary | ICD-10-CM | POA: Insufficient documentation

## 2019-11-03 DIAGNOSIS — F3112 Bipolar disorder, current episode manic without psychotic features, moderate: Secondary | ICD-10-CM | POA: Insufficient documentation

## 2019-11-03 DIAGNOSIS — F1920 Other psychoactive substance dependence, uncomplicated: Secondary | ICD-10-CM | POA: Insufficient documentation

## 2019-11-03 DIAGNOSIS — F1510 Other stimulant abuse, uncomplicated: Secondary | ICD-10-CM | POA: Insufficient documentation

## 2019-11-03 DIAGNOSIS — F17210 Nicotine dependence, cigarettes, uncomplicated: Secondary | ICD-10-CM | POA: Insufficient documentation

## 2019-11-03 LAB — COMPREHENSIVE METABOLIC PANEL
ALT: 20 U/L (ref 0–44)
AST: 26 U/L (ref 15–41)
Albumin: 4.8 g/dL (ref 3.5–5.0)
Alkaline Phosphatase: 68 U/L (ref 38–126)
Anion gap: 11 (ref 5–15)
BUN: 18 mg/dL (ref 6–20)
CO2: 29 mmol/L (ref 22–32)
Calcium: 9.6 mg/dL (ref 8.9–10.3)
Chloride: 100 mmol/L (ref 98–111)
Creatinine, Ser: 0.84 mg/dL (ref 0.61–1.24)
GFR calc Af Amer: >60 mL/min (ref >60)
GFR calc non Af Amer: >60 mL/min (ref >60)
Glucose, Bld: 87 mg/dL (ref 70–99)
Potassium: 4 mmol/L (ref 3.5–5.1)
Sodium: 140 mmol/L (ref 135–145)
Total Bilirubin: 0.9 mg/dL (ref 0.3–1.2)
Total Protein: 7.8 g/dL (ref 6.5–8.1)

## 2019-11-03 LAB — CBC
HCT: 40.1 % (ref 39.0–52.0)
Hemoglobin: 14.3 g/dL (ref 13.0–17.0)
MCH: 31 pg (ref 26.0–34.0)
MCHC: 35.7 g/dL (ref 30.0–36.0)
MCV: 86.8 fL (ref 80.0–100.0)
Platelets: 299 10*3/uL (ref 150–400)
RBC: 4.62 MIL/uL (ref 4.22–5.81)
RDW: 12.2 % (ref 11.5–15.5)
WBC: 10 10*3/uL (ref 4.0–10.5)
nRBC: 0 % (ref 0.0–0.2)

## 2019-11-03 LAB — URINE DRUG SCREEN, QUALITATIVE (ARMC ONLY)
Amphetamines, Ur Screen: NOT DETECTED
Barbiturates, Ur Screen: NOT DETECTED
Benzodiazepine, Ur Scrn: POSITIVE — AB
Cannabinoid 50 Ng, Ur ~~LOC~~: NOT DETECTED
Cocaine Metabolite,Ur ~~LOC~~: NOT DETECTED
MDMA (Ecstasy)Ur Screen: NOT DETECTED
Methadone Scn, Ur: NOT DETECTED
Opiate, Ur Screen: NOT DETECTED
Phencyclidine (PCP) Ur S: NOT DETECTED
Tricyclic, Ur Screen: NOT DETECTED

## 2019-11-03 LAB — ETHANOL: Alcohol, Ethyl (B): <10 mg/dL (ref <10)

## 2019-11-03 LAB — ACETAMINOPHEN LEVEL: Acetaminophen (Tylenol), Serum: <10 ug/mL — ABNORMAL LOW (ref 10–30)

## 2019-11-03 LAB — SALICYLATE LEVEL: Salicylate Lvl: <7 mg/dL — ABNORMAL LOW (ref 7.0–30.0)

## 2019-11-03 MED ORDER — ZIPRASIDONE MESYLATE 20 MG IM SOLR
10.0000 mg | Freq: Once | INTRAMUSCULAR | Status: AC
  Administered 2019-11-03: 01:00:00 10 mg via INTRAMUSCULAR
  Filled 2019-11-03: qty 20

## 2019-11-03 MED ORDER — NICOTINE 21 MG/24HR TD PT24
21.0000 mg | MEDICATED_PATCH | Freq: Once | TRANSDERMAL | Status: DC
  Administered 2019-11-03: 02:00:00 21 mg via TRANSDERMAL

## 2019-11-03 NOTE — ED Notes (Signed)
Patient out in hall asking about when he can leave.  He says he feels like he is sober now.  Eye contact better now.

## 2019-11-03 NOTE — ED Notes (Signed)
Peanut butter and crackers was given to patient at this time.

## 2019-11-03 NOTE — ED Notes (Signed)
Patient is in bed, but alert.  Encouraged to try to get some rest before breakfast.

## 2019-11-03 NOTE — ED Notes (Signed)
IVC, pend placement 

## 2019-11-03 NOTE — ED Triage Notes (Signed)
Pt arrived via [employer] under IVC. Per affidavit, pt at home, threatening father and family with needle. Pt is talking excessively, fast and is unorganized with thoughts. Pt is cooperative in triage and follow commands. Pt denies SI and HI.

## 2019-11-03 NOTE — ED Provider Notes (Signed)
[facility] Emergency Department Provider Note   ____________________________________________   First MD Initiated Contact with Patient 11/03/19 [phone]     (approximate)  I have reviewed the triage vital signs and the nursing notes.   HISTORY  Chief Complaint Mental Health Problem    HPI ROBSON TRICKEY is a 29 y.o. male brought to the ED under IVC for threatening family members with a needle.  Patient has a history of Bipolar disorder and polysubstance abuse.  Rest of history is unobtainable secondary to patient's yelling and screaming.       Past Medical History:  Diagnosis Date  . History of drug abuse [facility])     Patient Active Problem List   Diagnosis Date Noted  . Psychosis (HCC) 03/06/2019  . Bipolar I disorder (HCC) 02/20/2019  . 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 Substance induced mood disorder (HCC) 01/13/2018  . Benzodiazepine abuse (HCC) 01/13/2018    History reviewed. No pertinent surgical history.  Prior to Admission medications   Medication Sig Start Date End Date Taking? Authorizing Provider  gabapentin (NEURONTIN) 300 MG capsule Take 1 capsule (300 mg total) by mouth 3 (three) times daily. Patient not taking: Reported on 09/29/2019 02/23/19   Malvin Johns, MD    Allergies Bee venom  History reviewed. No pertinent family history.  Social History Social History   Tobacco Use  . Smoking status: Current Every Day Smoker    Packs/day: 1.00    Types: Cigarettes  . Smokeless tobacco: Never Used  Substance Use Topics  . Alcohol use: Yes    Comment: socially  . Drug use: Yes    Types: Methamphetamines    Comment: snorted today    Review of Systems  Constitutional: No fever/chills Eyes: No visual changes. ENT: No sore throat. Cardiovascular: Denies chest pain. Respiratory: Denies shortness  of breath. Gastrointestinal: No abdominal pain.  No nausea, no vomiting.  No diarrhea.  No constipation. Genitourinary: Negative for dysuria. Musculoskeletal: Negative for back pain. Skin: Negative for rash. Neurological: Negative for headaches, focal weakness or numbness. Psychiatric:  Positive for aggressive behavior and threatening family members.  ____________________________________________   PHYSICAL EXAM:  VITAL SIGNS: ED Triage Vitals [11/03/19 0048]  Enc Vitals Group     BP 132/66     Pulse Rate (!) 116     Resp 18     Temp 98.4 F (36.9 C)     Temp Source Oral     SpO2 98 %     Weight      Height      Head Circumference      Peak Flow      Pain Score      Pain Loc      Pain Edu?      Excl. in GC?     Constitutional: Alert and oriented.  Disheveled appearing and in mild acute distress. Eyes: Conjunctivae are normal. PERRL. EOMI. Head: Atraumatic. Nose: No congestion/rhinnorhea. Mouth/Throat: Mucous membranes are moist.  Oropharynx non-erythematous. Neck: No stridor.   Cardiovascular: Normal rate, regular rhythm. Grossly normal heart sounds.  Good peripheral circulation. Respiratory: Normal respiratory effort.  No retractions. Lungs CTAB. Gastrointestinal: Soft and nontender. No distention. No abdominal bruits. No CVA tenderness. Musculoskeletal: No lower extremity tenderness nor edema.  No joint effusions. Neurologic:  Normal speech and language. No gross focal neurologic deficits are appreciated. No gait instability. Skin:  Skin  is warm, dry and intact. No rash noted. Psychiatric: Mood and affect are aggressive.  Actively screaming and cursing obscenities at staff.  ____________________________________________   LABS (all labs ordered are listed, but only abnormal results are displayed)  Labs Reviewed  SALICYLATE LEVEL - Abnormal; Notable for the following components:      Result Value   Salicylate Lvl <4.7 (*)    All other components within normal  limits  ACETAMINOPHEN LEVEL - Abnormal; Notable for the following components:   Acetaminophen (Tylenol), Serum <10 (*)    All other components within normal limits  URINE DRUG SCREEN, QUALITATIVE ([facility] ONLY) - Abnormal; Notable for the following components:   Benzodiazepine, Ur Scrn POSITIVE (*)    All other components within normal limits  COMPREHENSIVE METABOLIC PANEL  ETHANOL  CBC   ____________________________________________  EKG  None ____________________________________________  RADIOLOGY  ED MD interpretation: None  Official radiology report(s): No results found.  ____________________________________________   PROCEDURES  Procedure(s) performed (including Critical Care):  Procedures   ____________________________________________   INITIAL IMPRESSION / ASSESSMENT AND PLAN / ED COURSE  As part of my medical decision making, I reviewed the following data within the [place] notes reviewed and incorporated, Labs reviewed, Old chart reviewed, A consult was requested and obtained from this/these consultant(s) Psychiatry and Notes from prior ED visits     EMMETT BRACKNELL was evaluated in Emergency Department on 11/03/2019 for the symptoms described in the history of present illness. He was evaluated in the context of the global COVID-19 pandemic, which necessitated consideration that the patient might be at risk for infection with the SARS-CoV-2 virus that causes COVID-19. Institutional protocols and algorithms that pertain to the evaluation of patients at risk for COVID-19 are in a state of rapid change based on information released by regulatory bodies including the CDC and federal and state organizations. These policies and algorithms were followed during the patient's care in the ED.    29 year old male with bipolar disorder brought to the ED under IVC for aggressive and threatening behavior.  He presents screaming, aggressive, cursing  obscenities at staff.  He is unable to be verbally redirected and requires IM calming agent.  Will maintain IVC pending psychiatric evaluation and disposition.   Clinical Course as of Nov 02 616  Tue Nov 03, 2019  0159 Patient sleeping in no acute distress.   [JS]  [id number] No further events overnight.  Patient remains in the ED under IVC pending [facility] psychiatry evaluation and disposition.   [JS]    Clinical Course User Index [JS] Paulette Blanch, MD     ____________________________________________   FINAL CLINICAL IMPRESSION(S) / ED DIAGNOSES  Final diagnoses:  Bipolar affective disorder, currently manic, moderate [facility])     ED Discharge Orders    None       Note:  This document was prepared using Dragon voice recognition software and may include unintentional dictation errors.   Paulette Blanch, MD 11/03/19 [phone]

## 2019-11-03 NOTE — ED Notes (Signed)
TTS was unable to conduct assessment at this time. Patient has been medicated and is unable to participate with the assessor.

## 2019-11-03 NOTE — ED Notes (Signed)
Patient screaming at this Clinical research associate and Psychologist, prison and probation services. Patient pacing in room requesting different medications. Patient redirected by this writer multiple time to lay down and to pleace stop hollering, other patients are trying to rest. Patient states, "I dont give a fuck," Patient came out of room to throw away sandwich tray and was standing in hallway. This Clinical research associate again redirected patient again. Patient finally went into room screaming, "I just want to leave, you Bitch."  Patient then layed down and appears to be resting with eyes closed.  Will continue to monitor.

## 2019-11-03 NOTE — Consult Note (Signed)
[facility] Face-to-Face Psychiatry Consult   Reason for Consult: IVC for aggressive behavior Referring Physician: Dr. Dolores Frame Patient Identification: Brian Osborn MRN:  [id number] Principal Diagnosis: <principal problem not specified> Diagnosis:  Active Problems:   * No active hospital problems. *   Total Time spent with patient: 45 minutes  Subjective:   Brian Osborn is a 29 y.o. male patient who presented under IVC for aggressive behavior in the home.  HPI:   Patient brought in under IVC for threatening his father.  Upon initial eval patient denied SI and HI but appeared to be speaking fast and patient appeared agitated.  Upon giving a chance to sober up, patient no longer appeared agitated and was calm and compliant with Clinical research associate.  He states that he was IVC by his father at this point since him and his father have been having familial difficulties since living together.  Patient states that last night this was worse due to his substance use as well as his drinking alcohol.  He denies being mad at his father but states that he would prefer to live someplace else for the time being.  Patient states that he currently is obtaining employment but hopes to get back into work soon.  He is adamantly denies any suicidal homicidal ideation or any mania, paranoia, psychotic symptoms.  He is agreeable to accept outpatient resources and attempt to find help for his substance abuse on an outpatient basis.   Past Psychiatric History: Patient acknowledges a history of psychiatric hospitalizations.  Patient states these are normally in the context of drug use as he will formally had a problem with meth and would often become psychotic when using. Risk to Self:  No Risk to Others:  No Prior Inpatient Therapy:  Yes Prior Outpatient Therapy:  Yes  Past Medical History:  Past Medical History:  Diagnosis Date  . History of drug abuse (HCC)    History reviewed. No pertinent surgical history. Family History:  History reviewed. No pertinent family history. Family Psychiatric  History: Denies Social History:  Social History   Substance and Sexual Activity  Alcohol Use Yes   Comment: socially     Social History   Substance and Sexual Activity  Drug Use Yes  . Types: Methamphetamines   Comment: snorted today    Social History   Socioeconomic History  . Marital status: Single    Spouse name: Not on file  . Number of children: Not on file  . Years of education: Not on file  . Highest education level: Not on file  Occupational History  . Not on file  Tobacco Use  . Smoking status: Current Every Day Smoker    Packs/day: 1.00    Types: Cigarettes  . Smokeless tobacco: Never Used  Substance and Sexual Activity  . Alcohol use: Yes    Comment: socially  . Drug use: Yes    Types: Methamphetamines    Comment: snorted today  . Sexual activity: Yes    Birth control/protection: None  Other Topics Concern  . Not on file  Social History Narrative  . Not on file   Social Determinants of Health   Financial Resource Strain:   . Difficulty of Paying Living Expenses: Not on file  Food Insecurity:   . Worried About Programme researcher, broadcasting/film/video in the Last Year: Not on file  . Ran Out of Food in the Last Year: Not on file  Transportation Needs:   . Lack of Transportation (Medical): Not on  file  . Lack of Transportation (Non-Medical): Not on file  Physical Activity:   . Days of Exercise per Week: Not on file  . Minutes of Exercise per Session: Not on file  Stress:   . Feeling of Stress : Not on file  Social Connections:   . Frequency of Communication with Friends and Family: Not on file  . Frequency of Social Gatherings with Friends and Family: Not on file  . Attends Religious Services: Not on file  . Active Member of Clubs or Organizations: Not on file  . Attends Archivist Meetings: Not on file  . Marital Status: Not on file   Additional Social History:    Allergies:    Allergies  Allergen Reactions  . Bee Venom Hives    Labs:  Results for orders placed or performed during the hospital encounter of 11/03/19 (from the past 48 hour(s))  Comprehensive metabolic panel     Status: None   Collection Time: 11/03/19 12:53 AM  Result Value Ref Range   Sodium 140 135 - 145 mmol/L   Potassium 4.0 3.5 - 5.1 mmol/L   Chloride 100 98 - 111 mmol/L   CO2 29 22 - 32 mmol/L   Glucose, Bld 87 70 - 99 mg/dL   BUN 18 6 - 20 mg/dL   Creatinine, Ser 0.84 0.61 - 1.24 mg/dL   Calcium 9.6 8.9 - 10.3 mg/dL   Total Protein 7.8 6.5 - 8.1 g/dL   Albumin 4.8 3.5 - 5.0 g/dL   AST 26 15 - 41 U/L   ALT 20 0 - 44 U/L   Alkaline Phosphatase 68 38 - 126 U/L   Total Bilirubin 0.9 0.3 - 1.2 mg/dL   GFR calc non Af Amer >60 >60 mL/min   GFR calc Af Amer >60 >60 mL/min   Anion gap 11 5 - 15    Comment: Performed at [facility], [address] [place], [place] [address]  Ethanol     Status: None   Collection Time: 11/03/19 12:53 AM  Result Value Ref Range   Alcohol, Ethyl (B) <10 <10 mg/dL    Comment: (NOTE) Lowest detectable limit for serum alcohol is 10 mg/dL. For medical purposes only. Performed at [facility], [address] [place], [place] [address]   Salicylate level     Status: Abnormal   Collection Time: 11/03/19 12:53 AM  Result Value Ref Range   Salicylate Lvl <3.2 (L) 7.0 - 30.0 mg/dL    Comment: Performed at [facility], [address] [place], [place] [address]  Acetaminophen level     Status: Abnormal   Collection Time: 11/03/19 12:53 AM  Result Value Ref Range   Acetaminophen (Tylenol), Serum <10 (L) 10 - 30 ug/mL    Comment: (NOTE) Therapeutic concentrations vary significantly. A range of 10-30 ug/mL  may be an effective concentration for many patients. However, some  are best treated at concentrations outside of this range. Acetaminophen concentrations >150 ug/mL at 4 hours after ingestion  and >50 ug/mL at 12 hours  after ingestion are often associated with  toxic reactions. Performed at [facility], [address] [place], [place] [address]   cbc     Status: None   Collection Time: 11/03/19 12:53 AM  Result Value Ref Range   WBC 10.0 4.0 - 10.5 K/uL   RBC 4.62 4.22 - 5.81 MIL/uL   Hemoglobin 14.3 13.0 - 17.0 g/dL   HCT 40.1 39.0 - 52.0 %  MCV 86.8 80.0 - 100.0 fL   MCH 31.0 26.0 - 34.0 pg   MCHC 35.7 30.0 - 36.0 g/dL   RDW 87.5 64.3 - 32.9 %   Platelets 299 150 - 400 K/uL   nRBC 0.0 0.0 - 0.2 %    Comment: Performed at [facility], [address] [place], [place] [address]  Urine Drug Screen, Qualitative     Status: Abnormal   Collection Time: 11/03/19 12:53 AM  Result Value Ref Range   Tricyclic, Ur Screen NONE DETECTED NONE DETECTED   Amphetamines, Ur Screen NONE DETECTED NONE DETECTED   MDMA (Ecstasy)Ur Screen NONE DETECTED NONE DETECTED   Cocaine Metabolite,Ur [place] NONE DETECTED NONE DETECTED   Opiate, Ur Screen NONE DETECTED NONE DETECTED   Phencyclidine (PCP) Ur S NONE DETECTED NONE DETECTED   Cannabinoid 50 Ng, Ur [place] NONE DETECTED NONE DETECTED   Barbiturates, Ur Screen NONE DETECTED NONE DETECTED   Benzodiazepine, Ur Scrn POSITIVE (A) NONE DETECTED   Methadone Scn, Ur NONE DETECTED NONE DETECTED    Comment: (NOTE) Tricyclics + metabolites, urine    Cutoff 1000 ng/mL Amphetamines + metabolites, urine  Cutoff 1000 ng/mL MDMA (Ecstasy), urine              Cutoff 500 ng/mL Cocaine Metabolite, urine          Cutoff 300 ng/mL Opiate + metabolites, urine        Cutoff 300 ng/mL Phencyclidine (PCP), urine         Cutoff 25 ng/mL Cannabinoid, urine                 Cutoff 50 ng/mL Barbiturates + metabolites, urine  Cutoff 200 ng/mL Benzodiazepine, urine              Cutoff 200 ng/mL Methadone, urine                   Cutoff 300 ng/mL The urine drug screen provides only a preliminary, unconfirmed analytical test result and should not be used for  non-medical purposes. Clinical consideration and professional judgment should be applied to any positive drug screen result due to possible interfering substances. A more specific alternate chemical method must be used in order to obtain a confirmed analytical result. Gas chromatography / mass spectrometry (GC/MS) is the preferred confirmat ory method. Performed at [facility], [address], [place], [place] [address]     Current Facility-Administered Medications  Medication Dose Route Frequency Sekai Nayak Last Rate Last Admin  . nicotine (NICODERM CQ - dosed in mg/24 hours) patch 21 mg  21 mg Transdermal Once Irean Hong, MD   21 mg at 11/03/19 0130   Current Outpatient Medications  Medication Sig Dispense Refill  . gabapentin (NEURONTIN) 300 MG capsule Take 1 capsule (300 mg total) by mouth 3 (three) times daily. (Patient not taking: Reported on 09/29/2019) 90 capsule 2    Musculoskeletal: Strength & Muscle Tone: within normal limits Gait & Station: normal Patient leans: N/A  Psychiatric Specialty Exam: Physical Exam  Review of Systems  Psychiatric/Behavioral: Positive for agitation, behavioral problems and dysphoric mood. Negative for hallucinations, self-injury and suicidal ideas. The patient is nervous/anxious and is hyperactive.     Blood pressure (!) 99/59, pulse 80, temperature 98.4 F (36.9 C), temperature source Oral, resp. rate 18, SpO2 100 %.There is no height or weight on file to calculate BMI.  General Appearance: Fairly Groomed  Eye Contact:  Fair  Speech:  Clear and Coherent  Volume:  Normal  Mood:  Anxious and Euthymic  Affect:  Congruent  Thought Process:  Coherent  Orientation:  Full (Time, Place, and Person)  Thought Content:  Logical  Suicidal Thoughts:  No  Homicidal Thoughts:  No  Memory:  Recent;   Fair  Judgement:  Fair  Insight:  Fair  Psychomotor Activity:  Normal  Concentration:  Concentration: Fair  Recall:  [employer] of  Knowledge:  Fair  Language:  Fair  Akathisia:  No  Handed:  Right  AIMS (if indicated):     Assets:  Communication Skills Desire for Improvement Physical Health Resilience  ADL's:  Intact  Cognition:  WNL  Sleep:        Treatment Plan Summary: 28 year old male with history of polysubstance abuse presenting to the ED intoxicated with behavior problems in the home.  Upon reevaluation patient is calm and cooperative and does not meet criteria for inpatient hospitalization.  Will be sent home with outpatient resources  Diagnosis: Polysubstance abuse  Disposition: No evidence of imminent risk to self or others at present.   Patient does not meet criteria for psychiatric inpatient admission. Supportive therapy provided about ongoing stressors. Discussed crisis plan, support from social network, calling 911, coming to the Emergency Department, and calling Suicide Hotline.  Clement Sayres, MD 11/03/2019 11:25 AM

## 2019-11-03 NOTE — ED Notes (Signed)
Patient currently awake and questioning why he is here.

## 2019-11-03 NOTE — ED Notes (Addendum)
Pt given juice and sandwich tray per his request.

## 2019-11-03 NOTE — BH Assessment (Signed)
Assessment Note  Brian Osborn is an 29 y.o. male who presents to the ER via law enforcement. Per the report of the patient, he and his father had an argument and it led to him calling law enforcement. Patient states the father called out of spite. He acknowledges and admits to what he's done but reports his father over reacted when he called the police.  During the interview, the patient was calm, cooperative and pleasant. He was able to provide appropriate answers to the questions. Throughout the interview, he denied SI/HI and AV/H.  Diagnosis: Substance Use disorder  Past Medical History:  Past Medical History:  Diagnosis Date  . History of drug abuse ([place])     History reviewed. No pertinent surgical history.  Family History: History reviewed. No pertinent family history.  Social History:  reports that he has been smoking cigarettes. He has been smoking about 1.00 pack per day. He has never used smokeless tobacco. He reports current alcohol use. He reports current drug use. Drug: Methamphetamines.  Additional Social History:  Alcohol / Drug Use Pain Medications: See PTA Prescriptions: See PTA Over the Counter: See PTA History of alcohol / drug use?: Yes Substance #1 Name of Substance 1: Alcohol 1 - Age of First Use: Unable to quantify 1 - Last Use / Amount: 11/02/2019 Substance #2 Name of Substance 2: Methamphetamine 2 - Last Use / Amount: Unable to quantify  CIWA: CIWA-Ar BP: (!) 101/53 Pulse Rate: 78 COWS:    Allergies:  Allergies  Allergen Reactions  . Bee Venom Hives    Home Medications: (Not in a hospital admission)   OB/GYN Status:  No LMP for male patient.  General Assessment Data Location of Assessment: [facility] ED TTS Assessment: In system Is this a Tele or Face-to-Face Assessment?: Face-to-Face Is this an Initial Assessment or a Re-assessment for this encounter?: Initial Assessment Patient Accompanied by:: N/A Language Other than English: No Living  Arrangements: Other (Comment)(Private Home) What gender do you identify as?: Male Marital status: Single Pregnancy Status: No Living Arrangements: Parent Can pt return to current living arrangement?: Yes Admission Status: Involuntary Petitioner: Family member Is patient capable of signing voluntary admission?: No(Under IVC) Referral Source: Self/Family/Friend Insurance type: None  Medical Screening Exam ([place]) Medical Exam completed: Yes  Crisis Care Plan Living Arrangements: Parent Legal Guardian: Other:(Self) Name of Psychiatrist: Reports of none Name of Therapist: Reports of none  Education Status Is patient currently in school?: No Is the patient employed, unemployed or receiving disability?: Unemployed  Risk to self with the past 6 months Suicidal Ideation: No Has patient been a risk to self within the past 6 months prior to admission? : No Suicidal Intent: No Has patient had any suicidal intent within the past 6 months prior to admission? : No Is patient at risk for suicide?: No Suicidal Plan?: No Has patient had any suicidal plan within the past 6 months prior to admission? : No Access to Means: No What has been your use of drugs/alcohol within the last 12 months?: Alcohol & Methamphetamine Previous Attempts/Gestures: No How many times?: 0 Other Self Harm Risks: Active substance use Triggers for Past Attempts: None known Intentional Self Injurious Behavior: None Family Suicide History: No Recent stressful life event(s): Other (Comment) Persecutory voices/beliefs?: No Depression: No Depression Symptoms: (Reports of none) Substance abuse history and/or treatment for substance abuse?: No Suicide prevention information given to non-admitted patients: Not applicable  Risk to Others within the past 6 months Homicidal Ideation: No  Does patient have any lifetime risk of violence toward others beyond the six months prior to admission? : No Thoughts of Harm  to Others: No Current Homicidal Intent: No Current Homicidal Plan: No Access to Homicidal Means: No Identified Victim: Reports of none History of harm to others?: No Assessment of Violence: None Noted Violent Behavior Description: Reports of none Does patient have access to weapons?: No Criminal Charges Pending?: No Does patient have a court date: No Is patient on probation?: No  Psychosis Hallucinations: None noted Delusions: None noted  Mental Status Report Appearance/Hygiene: Unremarkable, In scrubs Eye Contact: Good Motor Activity: Freedom of movement, Unremarkable Speech: Logical/coherent, Unremarkable Level of Consciousness: Alert Mood: Pleasant Affect: Appropriate to circumstance Anxiety Level: None Thought Processes: Coherent, Relevant Judgement: Unimpaired Orientation: Person, Place, Time, Situation, Appropriate for developmental age Obsessive Compulsive Thoughts/Behaviors: None  Cognitive Functioning Concentration: Normal Memory: Recent Intact, Remote Intact Is patient IDD: No Insight: Fair Impulse Control: Fair Appetite: Good Have you had any weight changes? : No Change Sleep: No Change Total Hours of Sleep: 8 Vegetative Symptoms: None  ADLScreening [facility] Assessment Services) Patient's cognitive ability adequate to safely complete daily activities?: Yes Patient able to express need for assistance with ADLs?: Yes Independently performs ADLs?: Yes (appropriate for developmental age)  Prior Inpatient Therapy Prior Inpatient Therapy: Yes Prior Therapy Dates: 02/2019 Prior Therapy Facilty/Provider(s): [facility] Reason for Treatment: Substance Use and Mood Disorder  Prior Outpatient Therapy Prior Outpatient Therapy: No Does patient have an ACCT team?: No Does patient have Intensive In-House Services?  : No Does patient have [facility] services? : No Does patient have P4CC services?: No  ADL Screening (condition at time of admission) Patient's cognitive  ability adequate to safely complete daily activities?: Yes Is the patient deaf or have difficulty hearing?: No Does the patient have difficulty seeing, even when wearing glasses/contacts?: No Does the patient have difficulty concentrating, remembering, or making decisions?: No Patient able to express need for assistance with ADLs?: Yes Does the patient have difficulty dressing or bathing?: No Independently performs ADLs?: Yes (appropriate for developmental age) Does the patient have difficulty walking or climbing stairs?: No Weakness of Legs: None Weakness of Arms/Hands: None  Home Assistive Devices/Equipment Home Assistive Devices/Equipment: None  Therapy Consults (therapy consults require a physician order) PT Evaluation Needed: No OT Evalulation Needed: No SLP Evaluation Needed: No Abuse/Neglect Assessment (Assessment to be complete while patient is alone) Physical Abuse: Denies Verbal Abuse: Denies Sexual Abuse: Denies Exploitation of patient/patient's resources: Denies Self-Neglect: Denies Values / Beliefs Cultural Requests During Hospitalization: None Spiritual Requests During Hospitalization: None Consults Spiritual Care Consult Needed: No Transition of Care Team Consult Needed: No  Child/Adolescent Assessment Running Away Risk: Denies(Patient is an adult)  Disposition:  Disposition Initial Assessment Completed for this Encounter: Yes  On Site Evaluation by:   Reviewed with Physician:    Lilyan Gilford MS, LCAS, [facility], NCC Therapeutic Triage Specialist 11/03/2019 1:13 PM
